# Patient Record
Sex: Female | Born: 1980
Health system: Southern US, Community
[De-identification: ages and names within clinical notes are randomized; demographics above are authoritative.]

## PROBLEM LIST (undated history)

## (undated) DIAGNOSIS — R0982 Postnasal drip: Secondary | ICD-10-CM

## (undated) DIAGNOSIS — J4530 Mild persistent asthma, uncomplicated: Principal | ICD-10-CM

## (undated) DIAGNOSIS — D649 Anemia, unspecified: Secondary | ICD-10-CM

## (undated) HISTORY — DX: Anemia, unspecified: D64.9

## (undated) HISTORY — DX: Postnasal drip: R09.82

## (undated) HISTORY — DX: Mild persistent asthma, uncomplicated: J45.30

---

## 1999-03-24 ENCOUNTER — Emergency Department (HOSPITAL_COMMUNITY): Admission: EM | Admit: 1999-03-24 | Discharge: 1999-03-24 | Payer: Self-pay | Admitting: Endocrinology

## 2001-12-05 ENCOUNTER — Emergency Department (HOSPITAL_COMMUNITY): Admission: EM | Admit: 2001-12-05 | Discharge: 2001-12-05 | Payer: Self-pay | Admitting: Emergency Medicine

## 2002-02-23 ENCOUNTER — Other Ambulatory Visit: Admission: RE | Admit: 2002-02-23 | Discharge: 2002-02-23 | Payer: Self-pay | Admitting: *Deleted

## 2002-05-18 ENCOUNTER — Ambulatory Visit (HOSPITAL_COMMUNITY): Admission: RE | Admit: 2002-05-18 | Discharge: 2002-05-18 | Payer: Self-pay | Admitting: Obstetrics and Gynecology

## 2002-05-18 ENCOUNTER — Encounter: Payer: Self-pay | Admitting: Obstetrics and Gynecology

## 2002-07-12 ENCOUNTER — Encounter (HOSPITAL_COMMUNITY): Admission: RE | Admit: 2002-07-12 | Discharge: 2002-08-11 | Payer: Self-pay | Admitting: *Deleted

## 2002-07-27 ENCOUNTER — Inpatient Hospital Stay (HOSPITAL_COMMUNITY): Admission: AD | Admit: 2002-07-27 | Discharge: 2002-07-27 | Payer: Self-pay | Admitting: *Deleted

## 2002-07-30 ENCOUNTER — Inpatient Hospital Stay (HOSPITAL_COMMUNITY): Admission: AD | Admit: 2002-07-30 | Discharge: 2002-08-01 | Payer: Self-pay | Admitting: Obstetrics and Gynecology

## 2002-07-31 ENCOUNTER — Encounter: Payer: Self-pay | Admitting: Obstetrics and Gynecology

## 2002-08-17 ENCOUNTER — Encounter: Admission: RE | Admit: 2002-08-17 | Discharge: 2002-08-17 | Payer: Self-pay | Admitting: *Deleted

## 2002-08-19 ENCOUNTER — Encounter (INDEPENDENT_AMBULATORY_CARE_PROVIDER_SITE_OTHER): Payer: Self-pay | Admitting: *Deleted

## 2002-08-19 ENCOUNTER — Inpatient Hospital Stay (HOSPITAL_COMMUNITY): Admission: AD | Admit: 2002-08-19 | Discharge: 2002-08-22 | Payer: Self-pay | Admitting: *Deleted

## 2002-09-27 ENCOUNTER — Other Ambulatory Visit: Admission: RE | Admit: 2002-09-27 | Discharge: 2002-09-27 | Payer: Self-pay | Admitting: *Deleted

## 2003-12-02 ENCOUNTER — Emergency Department (HOSPITAL_COMMUNITY): Admission: EM | Admit: 2003-12-02 | Discharge: 2003-12-02 | Payer: Self-pay | Admitting: Emergency Medicine

## 2004-05-02 ENCOUNTER — Other Ambulatory Visit: Admission: RE | Admit: 2004-05-02 | Discharge: 2004-05-02 | Payer: Self-pay | Admitting: Obstetrics and Gynecology

## 2005-03-22 ENCOUNTER — Emergency Department (HOSPITAL_COMMUNITY): Admission: EM | Admit: 2005-03-22 | Discharge: 2005-03-22 | Payer: Self-pay | Admitting: Emergency Medicine

## 2005-05-31 ENCOUNTER — Other Ambulatory Visit: Admission: RE | Admit: 2005-05-31 | Discharge: 2005-05-31 | Payer: Self-pay | Admitting: Obstetrics and Gynecology

## 2008-08-13 ENCOUNTER — Inpatient Hospital Stay (HOSPITAL_COMMUNITY): Admission: AD | Admit: 2008-08-13 | Discharge: 2008-08-13 | Payer: Self-pay | Admitting: Obstetrics & Gynecology

## 2009-05-23 ENCOUNTER — Ambulatory Visit: Payer: Self-pay | Admitting: Obstetrics & Gynecology

## 2009-05-30 ENCOUNTER — Ambulatory Visit: Payer: Self-pay | Admitting: Obstetrics & Gynecology

## 2009-05-30 ENCOUNTER — Inpatient Hospital Stay (HOSPITAL_COMMUNITY): Admission: AD | Admit: 2009-05-30 | Discharge: 2009-05-30 | Payer: Self-pay | Admitting: Obstetrics and Gynecology

## 2009-05-31 ENCOUNTER — Inpatient Hospital Stay (HOSPITAL_COMMUNITY): Admission: AD | Admit: 2009-05-31 | Discharge: 2009-05-31 | Payer: Self-pay | Admitting: Obstetrics and Gynecology

## 2009-06-07 ENCOUNTER — Ambulatory Visit: Payer: Self-pay | Admitting: Obstetrics and Gynecology

## 2009-06-13 ENCOUNTER — Ambulatory Visit: Payer: Self-pay | Admitting: Obstetrics & Gynecology

## 2009-06-18 ENCOUNTER — Inpatient Hospital Stay (HOSPITAL_COMMUNITY): Admission: AD | Admit: 2009-06-18 | Discharge: 2009-06-21 | Payer: Self-pay | Admitting: Obstetrics and Gynecology

## 2009-06-18 ENCOUNTER — Encounter (INDEPENDENT_AMBULATORY_CARE_PROVIDER_SITE_OTHER): Payer: Self-pay | Admitting: Obstetrics and Gynecology

## 2010-04-15 HISTORY — PX: ABDOMINAL HYSTERECTOMY: SHX81

## 2010-07-08 LAB — URINE MICROSCOPIC-ADD ON

## 2010-07-08 LAB — URINALYSIS, ROUTINE W REFLEX MICROSCOPIC
Bilirubin Urine: NEGATIVE
Glucose, UA: NEGATIVE mg/dL
Ketones, ur: NEGATIVE mg/dL
Nitrite: NEGATIVE
Protein, ur: NEGATIVE mg/dL
Specific Gravity, Urine: <1.005 — ABNORMAL LOW (ref 1.005–1.030)
Urobilinogen, UA: 0.2 mg/dL (ref 0.0–1.0)
pH: 6 (ref 5.0–8.0)

## 2010-07-08 LAB — CBC
HCT: 36.9 % (ref 36.0–46.0)
HCT: 37 % (ref 36.0–46.0)
Hemoglobin: 12.1 g/dL (ref 12.0–15.0)
Hemoglobin: 12.2 g/dL (ref 12.0–15.0)
MCHC: 32.9 g/dL (ref 30.0–36.0)
MCHC: 33 g/dL (ref 30.0–36.0)
MCV: 90.6 fL (ref 78.0–100.0)
MCV: 90.8 fL (ref 78.0–100.0)
Platelets: 142 10*3/uL — ABNORMAL LOW (ref 150–400)
Platelets: 143 10*3/uL — ABNORMAL LOW (ref 150–400)
RBC: 4.07 MIL/uL (ref 3.87–5.11)
RBC: 4.07 MIL/uL (ref 3.87–5.11)
RDW: 13.6 % (ref 11.5–15.5)
RDW: 13.6 % (ref 11.5–15.5)
WBC: 14.9 10*3/uL — ABNORMAL HIGH (ref 4.0–10.5)
WBC: 7 10*3/uL (ref 4.0–10.5)

## 2010-07-08 LAB — RPR: RPR Ser Ql: NONREACTIVE

## 2010-08-31 NOTE — Discharge Summary (Signed)
NAME:  Tina Eaton, Tina Eaton                       ACCOUNT NO.:  [id number]   MEDICAL RECORD NO.:  [id number]                   PATIENT TYPE:  INP   LOCATION:  9139                                 FACILITY:  [facility]   PHYSICIAN:  Dineen Kid. Rana Snare, M.D.                 DATE OF BIRTH:  02/12/1981   DATE OF ADMISSION:  08/19/2002  DATE OF DISCHARGE:  08/22/2002                                 DISCHARGE SUMMARY   ADMITTING DIAGNOSES:  1. Intrauterine pregnancy at term with twin gestation.  2. Twin A in the frank breech presentation.   DISCHARGE DIAGNOSES:  1. Status post primary low transverse cesarean section.  2. Viable female and female infants.   PROCEDURE:  Primary low transverse cesarean section.   REASON FOR ADMISSION:  Please see dictated H&P.   HOSPITAL COURSE:  The patient was a 30 year old primigravida that was  admitted to [facility] with a twin gestation at 48 weeks  estimated gestational age.  The patient was scheduled for a cesarean  delivery due to twin A in the breech presentation.  On the morning of her  surgery patient was prepped accordingly and taken to the operating room  where spinal anesthesia was administered without difficulty.  A low  transverse incision was made with delivery of twin A, a female weighing 7  pounds 2 ounces, Apgars of 8 at one minute and 9 at five minutes and twin B,  a female infant weighing 5 pounds 4 ounces, Apgars of 9 at one minute and 9 at  five minutes were delivered.  The patient tolerated procedure well and was  taken to the recovery room in stable condition.  On postoperative day one  vital signs were stable.  The patient had good return of bowel function.  Abdomen was soft.  Abdominal dressing was clean, dry, and intact.  Fundus  was firm and nontender.  Laboratories reveal hemoglobin 12.8, platelet count  156,000, WBC count of 7.9.  On postoperative day two patient was doing well.  Vital signs were stable.  Fundus was firm  and nontender.  Abdominal dressing  was removed that revealed an incision that was clean, dry, and intact.  The  patient was tolerating a regular diet without complaints of nausea and  vomiting.  She was also ambulating without assistance.  On postoperative day  three patient was doing well.  Vital signs were stable.  She was afebrile.  Fundus was firm and nontender.  Incision was clean, dry, and intact.  Staples were removed and patient was discharged home.   CONDITION ON DISCHARGE:  Good.   DIET:  Regular, as tolerated.   ACTIVITY:  No heavy lifting.  No driving x2 weeks.  No vaginal entry.   FOLLOW UP:  The patient is to follow up in the office in one to two weeks  for an incision check.  She is to call for temperature  greater than 100  degrees, persistent nausea and vomiting, heavy vaginal bleeding, and/or  redness or drainage from the incisional site.   DISCHARGE MEDICATIONS:  1. Tylox numbers 30 one p.o. q.4-6h. p.r.n.  2.     Prenatal vitamins one p.o. daily.  3. Colace one p.o. daily p.r.n.  4. Ibuprofen 600 mg q.6h. p.r.n.     Tina Eaton, N.P.                        Dineen Kid Rana Snare, M.D.    CC/MEDQ  D:  09/20/2002  T:  09/20/2002  Job:  [id number]

## 2010-08-31 NOTE — Op Note (Signed)
NAME:  Tina Eaton, Tina Eaton                       ACCOUNT NO.:  [id number]   MEDICAL RECORD NO.:  [id number]                   PATIENT TYPE:  INP   LOCATION:  9198                                 FACILITY:  [facility]   PHYSICIAN:  Tracie Harrier, M.D.              DATE OF BIRTH:  08-09-1980   DATE OF PROCEDURE:  08/19/2002  DATE OF DISCHARGE:                                 OPERATIVE REPORT   PREOPERATIVE DIAGNOSES:  1. Intrauterine pregnancy/twins at term.  2. Homero Fellers breech/vertex presentation.   POSTOPERATIVE DIAGNOSES:  1. Intrauterine pregnancy/twins at term.  2. Homero Fellers breech/vertex presentation.   PROCEDURE:  Primary low transverse cesarean section.   SURGEON:  Tracie Harrier, M.D.   ASSISTANT:  Stann Mainland. Vincente Poli, M.D.   ANESTHESIA:  Spinal.   ESTIMATED BLOOD LOSS:  800 mL.   COMPLICATIONS:  None.   FINDINGS:  At 22 through a low transverse uterine incision a viable female  infant was delivered from the frank breech presentation.  The baby weighed 7  pounds 2 ounces.  Apgars 8 and 9.   Baby B was delivered from the vertex presentation at 0751.  The baby was a  female weighing 5 pounds 4 ounces.  Apgars were 9 and 9.   The pelvis was visualized at time of surgery and noted to be normal.   PROCEDURE:  The patient was taken to the operating room where a spinal  anesthetic was administered.  The patient was placed on the operating table  in the left lateral tilt position.  The abdomen was prepped and draped in  the usual sterile fashion with Betadine and sterile drapes.  A Foley  catheter was sterilely inserted.  The abdomen was entered through a  Pfannenstiel incision and carried down sharply in the usual fashion.  The  peritoneum was atraumatically entered.  The vesicouterine peritoneum  overlying the lower uterine segment was incised and a bladder flap was  bluntly and sharply created over the lower uterine segment.  A bladder blade  was then placed behind the  bladder to ensure its protection during the  procedure.  The uterus was then entered through a low transverse incision  and carried out laterally using the operator's fingers.  The membranes were  entered with clear fluid noted.  The frank breech presentation was elevated  into the incision and delivered promptly and easily with the usual breech  maneuvers with delivery at 0750.  The oro and nasopharynx were thoroughly  bulb suctioned and the cord doubly clamped and cut.  The baby handed  promptly to the pediatricians.  The baby was a female infant weighing 7  pounds 2 ounces.  Apgars were 8 and 9.  The baby did well.  Next, baby B's  membranes were ruptured with once again clear fluid noted.  The vertex was  guided into the incision and delivered promptly and easily at 0751.  The oro  and nasopharynx were once again thoroughly bulb suctioned, the cord doubly  clamped and cut, and the baby handed promptly to the pediatricians.  Baby B  was a female infant weighing 5 pounds 4 ounces.  Apgars were 9 and 9.  Baby B  also did well.   The placenta was then manually extracted intact with three vessel cord x2.  The interior of the uterus was wiped clean thoroughly with a wet sponge.  The uterine incision was then closed in a two layer fashion, the first layer  a running interlocking suture of number 1 Vicryl.  A second imbricating  suture was placed across the primary suture line with a running stitch of  number 1 Vicryl as well.  Hemostasis was achieved.  The pelvis was  visualized and noted to be normal.  The pelvis was then thoroughly irrigated  with hemostasis noted.   Attention was then turned to closure.  The rectus muscle and anterior  peritoneum was closed with a running suture of number 1 Vicryl.  The  subfascial areas were hemostatic.  The fascia was then closed with a running  suture of 0 PDS.  Subcutaneous tissue was irrigated and made hemostatic  using the Bovie cautery.  The skin  reapproximated with staples and a sterile  dressing applied.   Final sponge, needle, and instrument count was correct x3.  There were no  perioperative complications and blood loss was average.  The patient did  receive an intravenous antibiotic (Cefotan) after cord clamp of baby B.                                               Tracie Harrier, M.D.    REG/MEDQ  D:  08/19/2002  T:  08/19/2002  Job:  [id number]

## 2010-08-31 NOTE — Discharge Summary (Signed)
   NAME:  Tina Eaton, Tina Eaton                       ACCOUNT NO.:  [id number]   MEDICAL RECORD NO.:  [id number]                   PATIENT TYPE:  INP   LOCATION:  9158                                 [facility]ACILITY:  [facility]   PHYSICIAN:  Juluis Mire, M.D.                DATE OF BIRTH:  1980-08-23   DATE OF ADMISSION:  07/30/2002  DATE OF DISCHARGE:  08/01/2002                                 DISCHARGE SUMMARY   ADMISSION DIAGNOSES:  Twin pregnancies at 39 1/2 weeks with pre-term uterine  activity.   DISCHARGE DIAGNOSES:  Twin pregnancies at 17 1/2 weeks with pre-term uterine  activity.   PROCEDURE:  Tocolysis.   HISTORY AND PHYSICAL:  Please see written note.   HOSPITAL COURSE:  The patient was admitted and begun on IV magnesium  sulfate. She reached a max of 2 grams per hour. With this, she had  resolution of uterine activity. An ultrasound was performed which reveals  the twin pregnancies to be in the vertex breech presentation. Bi-physical  on A was 6 out of 8 with normal amniotic fluid. Bi-physical on twin B was 8  out of 8 with normal amniotic fluid. Cervical length was 1.5 cm. We weaned  her off her magnesium sulfate through the 17th. She was maintained on by  mouth Terbutaline throughout the evening of the 17th. On the morning of the  18th, she was having no uterine activities. Fetal heart rates of A and B  were reactive. Cervix was completely unchanged and the patient was  discharged to home.   LABORATORY DATA:  Normal white count of 7,600. Platelet count was 151,000.   CONDITION ON DISCHARGE:  Stable.   DISPOSITION:  Discharge to home. Routine instructions given for pre-term  labor. She is to be at relative rest. No vaginal entrance. She will continue  on by mouth Terbutaline 2.5 mg every four hours. She will follow-up in the  office on Tuesday for reassessment. Pre-term labor warning signs are again  given.                                                Juluis Mire,  M.D.    JSM/MEDQ  D:  08/01/2002  T:  08/01/2002  Job:  [id number]

## 2010-08-31 NOTE — H&P (Signed)
   NAME:  Tina Eaton, Tina Eaton                       ACCOUNT NO.:  [id number]   MEDICAL RECORD NO.:  [id number]                   PATIENT TYPE:  INP   LOCATION:  9198                                 [facility]   PHYSICIAN:  Tracie Harrier, M.D.              DATE OF BIRTH:  12/21/1980   DATE OF ADMISSION:  08/19/2002  DATE OF DISCHARGE:                                HISTORY & PHYSICAL   HISTORY OF PRESENT ILLNESS:  The patient is a 30 year old female gravida 1  with twin gestation at [redacted] weeks gestation.  The patient is admitted for  primary cesarean section due to breech and vertex presentation.  Her  pregnancy has been complicated again by breech presentation and preterm  labor.  She has been followed very closely with serial ultrasounds and  nonstress tests throughout her pregnancy.  She has also been on terbutaline  for preterm labor.  Her last cervical examination was 2 cm dilated, 50%  effaced, -3 station.  She has had a persistent breech presentation for baby  A.  The babies have showed concordant growth and have exhibited reassuring  fetal surveillance.   OB LABORATORIES:  Maternal blood type A+.  Rubella immune.  Glucola 138.  Group B Strep unknown.   PAST MEDICAL HISTORY:  History of Chlamydia.   PAST SURGICAL HISTORY:  None.   CURRENT MEDICATIONS:  1. Prenatal vitamins.  2. Terbutaline.   ALLERGIES:  None known.   PHYSICAL EXAMINATION:  VITAL SIGNS:  Stable.  Blood pressure 110/72.  Fetal  heart tones reassuring x2.  GENERAL:  She is a well-developed, well-nourished female in no acute  distress.  HEENT:  Within normal limits.  NECK:  Supple without adenopathy or thyromegaly.  HEART:  Regular rate and rhythm without murmur, gallop, or rub.  LUNGS:  Clear to auscultation.  BREASTS:  Deferred.  ABDOMEN:  Gravid with a fundal height of 50 cm.  EXTREMITIES:  Grossly normal.  NEUROLOGIC:  Grossly normal.  PELVIC:  Deferred upon admission.   ADMITTING  DIAGNOSES:  1. Intrauterine pregnancy/twin gestation at 38 weeks.  2. Breech/vertex presentation.   PLAN:  Primary cesarean section.    DISCUSSION:  The risks and benefits of surgery explained to patient.  The  risk of surgery and the need for surgery given baby A's position was  reviewed.  She was allowed to ask questions and wished to proceed with  primary cesarean section.                                               Tracie Harrier, M.D.    REG/MEDQ  D:  08/19/2002  T:  08/19/2002  Job:  [id number]

## 2010-09-05 ENCOUNTER — Encounter (HOSPITAL_COMMUNITY): Payer: 59

## 2010-09-05 ENCOUNTER — Other Ambulatory Visit: Payer: Self-pay | Admitting: Obstetrics and Gynecology

## 2010-09-05 LAB — CBC
HCT: 37.6 % (ref 36.0–46.0)
Hemoglobin: 12.2 g/dL (ref 12.0–15.0)
MCH: 28.6 pg (ref 26.0–34.0)
MCHC: 32.4 g/dL (ref 30.0–36.0)
MCV: 88.1 fL (ref 78.0–100.0)
Platelets: 224 10*3/uL (ref 150–400)
RBC: 4.27 MIL/uL (ref 3.87–5.11)
RDW: 12.3 % (ref 11.5–15.5)
WBC: 5 10*3/uL (ref 4.0–10.5)

## 2010-09-05 LAB — SURGICAL PCR SCREEN
MRSA, PCR: NEGATIVE
Staphylococcus aureus: NEGATIVE

## 2010-09-12 ENCOUNTER — Ambulatory Visit (HOSPITAL_COMMUNITY)
Admission: RE | Admit: 2010-09-12 | Discharge: 2010-09-13 | Disposition: A | Discharge status: Home / Self Care | Payer: 59 | Source: Ambulatory Visit | Attending: Obstetrics and Gynecology | Admitting: Obstetrics and Gynecology

## 2010-09-12 ENCOUNTER — Other Ambulatory Visit: Payer: Self-pay | Admitting: Obstetrics and Gynecology

## 2010-09-12 DIAGNOSIS — Z01812 Encounter for preprocedural laboratory examination: Secondary | ICD-10-CM | POA: Insufficient documentation

## 2010-09-12 DIAGNOSIS — Z01818 Encounter for other preprocedural examination: Secondary | ICD-10-CM | POA: Insufficient documentation

## 2010-09-12 DIAGNOSIS — D252 Subserosal leiomyoma of uterus: Secondary | ICD-10-CM | POA: Insufficient documentation

## 2010-09-12 DIAGNOSIS — N83209 Unspecified ovarian cyst, unspecified side: Secondary | ICD-10-CM | POA: Insufficient documentation

## 2010-09-12 DIAGNOSIS — D251 Intramural leiomyoma of uterus: Secondary | ICD-10-CM | POA: Insufficient documentation

## 2010-09-12 DIAGNOSIS — N920 Excessive and frequent menstruation with regular cycle: Secondary | ICD-10-CM | POA: Insufficient documentation

## 2010-09-12 DIAGNOSIS — R1031 Right lower quadrant pain: Secondary | ICD-10-CM | POA: Insufficient documentation

## 2010-09-13 LAB — CBC
HCT: 33.5 % — ABNORMAL LOW (ref 36.0–46.0)
Hemoglobin: 11 g/dL — ABNORMAL LOW (ref 12.0–15.0)
MCH: 29.2 pg (ref 26.0–34.0)
MCHC: 32.8 g/dL (ref 30.0–36.0)
MCV: 88.9 fL (ref 78.0–100.0)
Platelets: 225 10*3/uL (ref 150–400)
RBC: 3.77 MIL/uL — ABNORMAL LOW (ref 3.87–5.11)
RDW: 12.4 % (ref 11.5–15.5)
WBC: 6.9 10*3/uL (ref 4.0–10.5)

## 2010-09-13 NOTE — H&P (Signed)
  Tina Eaton, Tina Eaton             ACCOUNT NO.:  [id number]  MEDICAL RECORD NO.:  [id number]        PATIENT TYPE:  WAMB  LOCATION:                                FACILITY:  [facility]  PHYSICIAN:  Guy Sandifer. Henderson Cloud, M.D. DATE OF BIRTH:  [birth year]  DATE OF ADMISSION:  09/12/2010 DATE OF DISCHARGE:                             HISTORY & PHYSICAL   CHIEF COMPLAINT:  Heavy painful menses.  HISTORY OF PRESENT ILLNESS:  This patient is a 30 year old G2, P4, status post C-section and tubal ligation who has increasingly heavy menses.  She will change a tampon and a pad every hour on several days each month.  She has also had right lower quadrant pain especially with her menses.  Ultrasound in my office revealed a uterus measuring 11.1 x 6.3 x 6.9 cm.  There are multiple intramural and subserosal leiomyomata measuring 1.4-2.4 cm in diameter.  Left ovary has a 4-cm simple cyst at the time of ultrasound in January 2012.  Right ovary appeared normal. Sonohysterogram was negative for intracavitary masses.  The bleeding has gotten steadily worse and after discussion of options she is being admitted for laparoscopically-assisted vaginal hysterectomy and removal of the tube and ovary only if distinctly abnormal.  Potential risks and complications have been discussed preoperatively.  PAST MEDICAL HISTORY:  Negative.  PAST SURGICAL HISTORY:  Negative.  OB HISTORY:  Cesarean section x2.  MEDICATIONS:  None.  ALLERGIES:  No known drug allergies.  SOCIAL HISTORY:  Denies tobacco, alcohol or drug abuse.  FAMILY HISTORY:  Positive for varicosities in her mother, maternal grandmother, breast cancer maternal grandmother.  REVIEW OF SYSTEMS:  NEURO:  Denies headache.  CARDIAC:  Denies chest pain.  PULMONARY:  Denies shortness of breath.  PHYSICAL EXAMINATION:  LUNGS:  Clear to auscultation. HEART:  Regular rate and rhythm. ABDOMEN:  Soft, nontender without masses. PELVIC:  Uterus is mobile and  tender.  Exam somewhat compromised due to the patient's habitus.  Adnexal exam without palpable masses. EXTREMITIES:  Grossly within normal limits. NEUROLOGICAL:  Grossly within normal limits.  ASSESSMENT:  Uterine leiomyomata.  PLAN:  Laparoscopically-assisted vaginal hysterectomy.     Guy Sandifer Henderson Cloud, M.D.     JET/MEDQ  D:  09/11/2010  T:  09/11/2010  Job:  [id number]  Electronically Signed by Harold Hedge M.D. on 09/13/2010 01:16:00 PM

## 2010-09-13 NOTE — Op Note (Signed)
Tina Eaton, Tina Eaton             ACCOUNT NO.:  [id number]  MEDICAL RECORD NO.:  [id number]           PATIENT TYPE:  O  LOCATION:  9316                          FACILITY:  [facility]  PHYSICIAN:  Guy Sandifer. Henderson Cloud, M.D. DATE OF BIRTH:  February 21, 1981  DATE OF PROCEDURE:  09/12/2010 DATE OF DISCHARGE:                              OPERATIVE REPORT   PREOPERATIVE DIAGNOSIS:  Uterine leiomyomata.  POSTOPERATIVE DIAGNOSES: 1. Uterine leiomyomata. 2. Pelvic adhesions.  PROCEDURES:  Laparoscopically-assisted vaginal hysterectomy, bilateral partial salpingectomy, lysis of adhesions.  SURGEON:  Guy Sandifer. Henderson Cloud, MD  ASSISTANT:  Juluis Mire, MD  ANESTHESIA:  General endotracheal intubation.  SPECIMENS:  Uterus and bilateral proximal fallopian tube segments to pathology.  ESTIMATED BLOOD LOSS:  100 mL.  INDICATIONS AND CONSENT:  This patient is a 30 year old G2, P4, status post C-section tubal ligation with increasingly heavy menses.  Details are dictated in the history and physical.  Laparoscopically-assisted vaginal hysterectomy, removal of the tube and ovary if distinctly abnormal have been discussed preoperatively.  Potential risks and complications have been discussed preoperatively including but not limited to infection, organ damage, bleeding requiring transfusion of blood products with HIV and hepatitis acquisition, DVT, PE, pneumonia, fistula formation, pelvic pain, pain with intercourse, laparotomy.  All questions have been answered and consent was signed on the chart.  FINDINGS:  Upper abdomen is grossly normal.  Uterus is about 10 weeks in size.  There is approximately 2 cm thick adhesion from the anterior upper uterine fundus to the anterior abdominal wall.  There are multiple filmy adhesions below this extending to the vesicouterine fold.  The right fallopian tube proximal to her point of tubal ligation has approximately 1 cm diameter hydrosalpinx.  The ovaries are  normal bilaterally.  Posterior cul-de-sac is normal.  PROCEDURE:  The patient was taken to the operating room where she was identified, placed in the dorsal supine position and general anesthesia was induced via endotracheal intubation.  She was placed in the dorsal lithotomy position.  Time-out was undertaken.  Using latex free supplies, the abdomen and vagina were prepped with Betadine.  Straight catheterized.  Hulka tenaculum was placed in the uterus as a manipulator and she was draped in a sterile fashion.  The infraumbilical and suprapubic areas were injected in the midline with approximately 5 mL of 0.5% plain Marcaine.  A small infraumbilical incision was made. Disposable Veress needle was placed on the first attempt without difficulty.  A normal syringe and drop test were noted.  Two liters of gas were then insufflated under low pressure with good tympany in the right upper quadrant.  Veress needle was removed.  A 10/11 Xcel bladeless disposable trocar sleeve was placed using direct visualization with the diagnostic laparoscopic.  After placement, the operative laparoscope was used.  The above findings were noted.  Using the Enseal bipolar cautery cutting instrument, the adhesions of the uterine fundus and the anterior abdominal wall were taken down.  Then, a small suprapubic incision was made in the midline above the level of the previous adhesion.  A 5-mm Xcel bladeless disposable trocar sleeve was then placed under direct visualization.  There was thickening of the pedicles bilaterally above the vesicouterine fold.  However, these were taken down in a careful stepwise fashion along the uterine contour using the Enseal bipolar cautery cutting instrument.  This was done well clear of the pelvic sidewalls down to the level of the vesicouterine peritoneum bilaterally.  Vesicouterine peritoneum was then taken down cephalad laterally with the Enseal product as well.  Good hemostasis  was noted.  Suprapubic trocar sleeve was removed along with instruments. Attention was then turned to the vagina.  Posterior cul-de-sac was entered sharply without difficulty and the cervix was circumscribed with unipolar cautery.  Mucosa was advanced sharply and bluntly.  Then, using the super jaws bipolar cautery cutting instrument, the uterosacral ligaments were taken down in a stepwise fashion to the bladder pillars, cardinal ligaments, uterine vessels bilaterally.  Fundus was delivered posteriorly and the remainder of the pedicles were taken down and the specimen was safely delivered.  The uterosacral ligaments were then plicated to the vaginal cuff bilaterally using 0 Monocryl suture.  All suture would be 0 Monocryl unless otherwise designated.  Uterosacral ligament was then plicated in the midline with a third suture.  Cuff was closed with figure-of-eights.  Foley catheter was placed in the bladder and clear urine was noted.  Attention was then returned to the abdomen. Pneumoperitoneum was recreated.  Suprapubic trocar sleeve was reintroduced under direct visualization and copious irrigation was carried out.  Very minor oozing at peritoneal edges was controlled with bipolar cautery.  The remaining 25 mL of 0.5% plain Marcaine was instilled into the peritoneal cavity.  Pneumoperitoneum was reduced and all trocar sleeves were removed as well.  The skin incisions were closed with interrupted 2-0 Vicryl suture.  Dermabond was placed on both incisions.  All counts were correct.  The patient was awakened and taken to recovery room in stable condition.     Guy Sandifer Henderson Cloud, M.D.     JET/MEDQ  D:  09/12/2010  T:  09/12/2010  Job:  [id number]  Electronically Signed by Harold Hedge M.D. on 09/13/2010 01:16:05 PM

## 2010-10-12 NOTE — Discharge Summary (Signed)
  NAMEJAYONNA, MEYERING             ACCOUNT NO.:  [id number]  MEDICAL RECORD NO.:  [id number]           PATIENT TYPE:  O  LOCATION:  9316                          [facility]ILITY:  [facility]  PHYSICIAN:  Guy Sandifer. Henderson Cloud, M.D. DATE OF BIRTH:  11/03/1980  DATE OF ADMISSION:  09/12/2010 DATE OF DISCHARGE:  09/13/2010                              DISCHARGE SUMMARY   ADMITTING DIAGNOSIS:  Uterine leiomyomata.  DISCHARGE DIAGNOSES:  Uterine leiomyomata and pelvic adhesions.  PROCEDURE:  On May 31 is laparoscopically-assisted vaginal hysterectomy, bilateral partial salpingectomy, and lysis of adhesions.  REASON FOR ADMISSION:  This patient is a 30 year old married black female status post tubal ligation with increasingly symptomatic uterine leiomyomata.  Details are dictated in the history and physical.  She is admitted for surgical management.  HOSPITAL COURSE:  She admits she is admitted to the hospital, undergoes the above procedure.  Estimated blood loss was 100 mL.  On the evening of surgery, she has good pain relief and she is ambulating.  Vital signs are stable.  She was afebrile with clear urine output.  On the day of discharge, she was tolerating regular diet, ambulating, voiding, passing flatus with good pain control and wants to go home.  Vital signs were stable.  She was afebrile.  Abdomen was soft with good bowel sounds. Hemoglobin was 11.0.  CONDITION ON DISCHARGE:  Good.  DIET:  Regular as tolerated.  ACTIVITY:  No lifting, no operation of automobiles, no vaginal entry. She is to call the office for problems including not limited to temperature of 101 degrees, persistent nausea, vomiting, increasing pain, or heavy vaginal bleeding.  MEDICATIONS: 1. Percocet 5/325, #30, 1-2 p.o. q.6 h. p.r.n. 2. Ibuprofen 600 mg q.6 h. p.r.n. 3. Multivitamin daily.  Followup is in the office in 2 weeks.     Guy Sandifer Henderson Cloud, M.D.     JET/MEDQ  D:  09/13/2010  T:  09/14/2010  Job:   [id number]  Electronically Signed by Harold Hedge M.D. on 10/12/2010 09:11:53 AM

## 2011-03-05 ENCOUNTER — Other Ambulatory Visit: Payer: Self-pay | Admitting: Dermatology

## 2012-01-24 ENCOUNTER — Encounter: Payer: Self-pay | Admitting: Internal Medicine

## 2012-01-24 ENCOUNTER — Other Ambulatory Visit (INDEPENDENT_AMBULATORY_CARE_PROVIDER_SITE_OTHER): Payer: 59

## 2012-01-24 ENCOUNTER — Ambulatory Visit (INDEPENDENT_AMBULATORY_CARE_PROVIDER_SITE_OTHER): Payer: 59 | Admitting: Internal Medicine

## 2012-01-24 VITALS — BP 108/72 | HR 71 | Temp 98.5°F | Resp 16 | Ht 64.0 in | Wt 249.5 lb

## 2012-01-24 DIAGNOSIS — R0989 Other specified symptoms and signs involving the circulatory and respiratory systems: Secondary | ICD-10-CM

## 2012-01-24 DIAGNOSIS — J4530 Mild persistent asthma, uncomplicated: Secondary | ICD-10-CM | POA: Insufficient documentation

## 2012-01-24 DIAGNOSIS — R079 Chest pain, unspecified: Secondary | ICD-10-CM

## 2012-01-24 DIAGNOSIS — R0683 Snoring: Secondary | ICD-10-CM

## 2012-01-24 DIAGNOSIS — R0609 Other forms of dyspnea: Secondary | ICD-10-CM

## 2012-01-24 DIAGNOSIS — R0600 Dyspnea, unspecified: Secondary | ICD-10-CM

## 2012-01-24 HISTORY — DX: Mild persistent asthma, uncomplicated: J45.30

## 2012-01-24 LAB — COMPREHENSIVE METABOLIC PANEL
ALT: 17 U/L (ref 0–35)
AST: 14 U/L (ref 0–37)
Albumin: 4 g/dL (ref 3.5–5.2)
Alkaline Phosphatase: 53 U/L (ref 39–117)
BUN: 11 mg/dL (ref 6–23)
CO2: 26 mEq/L (ref 19–32)
Calcium: 9.2 mg/dL (ref 8.4–10.5)
Chloride: 106 mEq/L (ref 96–112)
Creatinine, Ser: 0.7 mg/dL (ref 0.4–1.2)
GFR: 128 mL/min (ref >60.00)
Glucose, Bld: 97 mg/dL (ref 70–99)
Potassium: 4.2 mEq/L (ref 3.5–5.1)
Sodium: 138 mEq/L (ref 135–145)
Total Bilirubin: 0.7 mg/dL (ref 0.3–1.2)
Total Protein: 7.4 g/dL (ref 6.0–8.3)

## 2012-01-24 LAB — CBC WITH DIFFERENTIAL/PLATELET
Basophils Absolute: 0 10*3/uL (ref 0.0–0.1)
Basophils Relative: 0.6 % (ref 0.0–3.0)
Eosinophils Absolute: 0.1 10*3/uL (ref 0.0–0.7)
Eosinophils Relative: 2.3 % (ref 0.0–5.0)
HCT: 38.2 % (ref 36.0–46.0)
Hemoglobin: 12.6 g/dL (ref 12.0–15.0)
Lymphocytes Relative: 43.2 % (ref 12.0–46.0)
Lymphs Abs: 2.4 10*3/uL (ref 0.7–4.0)
MCHC: 33 g/dL (ref 30.0–36.0)
MCV: 89.7 fl (ref 78.0–100.0)
Monocytes Absolute: 0.3 10*3/uL (ref 0.1–1.0)
Monocytes Relative: 5.9 % (ref 3.0–12.0)
Neutro Abs: 2.7 10*3/uL (ref 1.4–7.7)
Neutrophils Relative %: 48 % (ref 43.0–77.0)
Platelets: 211 10*3/uL (ref 150.0–400.0)
RBC: 4.25 Mil/uL (ref 3.87–5.11)
RDW: 12.8 % (ref 11.5–14.6)
WBC: 5.5 10*3/uL (ref 4.5–10.5)

## 2012-01-24 LAB — BRAIN NATRIURETIC PEPTIDE: Pro B Natriuretic peptide (BNP): 8 pg/mL (ref 0.0–100.0)

## 2012-01-24 LAB — TSH: TSH: 1.02 u[IU]/mL (ref 0.35–5.50)

## 2012-01-24 LAB — TROPONIN I: Troponin I: <0.01 ng/mL

## 2012-01-24 NOTE — Assessment & Plan Note (Signed)
Her EKG shows loss of voltage and diffuse T wave inversion (this is c/w her body habitus), I will check her labs today to see if she has any evidence of ischemia, CHF, anemia, or abnormal lytes

## 2012-01-24 NOTE — Assessment & Plan Note (Signed)
This appears to be due to poor conditioning, I will check labs to look for secondary causes

## 2012-01-24 NOTE — Assessment & Plan Note (Signed)
Pulm/sleep referral

## 2012-01-24 NOTE — Patient Instructions (Signed)
Chest Pain (Nonspecific) It is often hard to give a specific diagnosis for the cause of chest pain. There is always a chance that your pain could be related to something serious, such as a heart attack or a blood clot in the lungs. You need to follow up with your caregiver for further evaluation. CAUSES   Heartburn.  Pneumonia or bronchitis.  Anxiety or stress.  Inflammation around your heart (pericarditis) or lung (pleuritis or pleurisy).  A blood clot in the lung.  A collapsed lung (pneumothorax). It can develop suddenly on its own (spontaneous pneumothorax) or from injury (trauma) to the chest.  Shingles infection (herpes zoster virus). The chest wall is composed of bones, muscles, and cartilage. Any of these can be the source of the pain.  The bones can be bruised by injury.  The muscles or cartilage can be strained by coughing or overwork.  The cartilage can be affected by inflammation and become sore (costochondritis). DIAGNOSIS  Lab tests or other studies, such as X-rays, electrocardiography, stress testing, or cardiac imaging, may be needed to find the cause of your pain.  TREATMENT   Treatment depends on what may be causing your chest pain. Treatment may include:  Acid blockers for heartburn.  Anti-inflammatory medicine.  Pain medicine for inflammatory conditions.  Antibiotics if an infection is present.  You may be advised to change lifestyle habits. This includes stopping smoking and avoiding alcohol, caffeine, and chocolate.  You may be advised to keep your head raised (elevated) when sleeping. This reduces the chance of acid going backward from your stomach into your esophagus.  Most of the time, nonspecific chest pain will improve within 2 to 3 days with rest and mild pain medicine. HOME CARE INSTRUCTIONS   If antibiotics were prescribed, take your antibiotics as directed. Finish them even if you start to feel better.  For the next few days, avoid physical  activities that bring on chest pain. Continue physical activities as directed.  Do not smoke.  Avoid drinking alcohol.  Only take over-the-counter or prescription medicine for pain, discomfort, or fever as directed by your caregiver.  Follow your caregiver's suggestions for further testing if your chest pain does not go away.  Keep any follow-up appointments you made. If you do not go to an appointment, you could develop lasting (chronic) problems with pain. If there is any problem keeping an appointment, you must call to reschedule. SEEK MEDICAL CARE IF:   You think you are having problems from the medicine you are taking. Read your medicine instructions carefully.  Your chest pain does not go away, even after treatment.  You develop a rash with blisters on your chest. SEEK IMMEDIATE MEDICAL CARE IF:   You have increased chest pain or pain that spreads to your arm, neck, jaw, back, or abdomen.  You develop shortness of breath, an increasing cough, or you are coughing up blood.  You have severe back or abdominal pain, feel nauseous, or vomit.  You develop severe weakness, fainting, or chills.  You have a fever. THIS IS AN EMERGENCY. Do not wait to see if the pain will go away. Get medical help at once. Call your local emergency services (911 in [place] Do not drive yourself to the hospital. MAKE SURE YOU:   Understand these instructions.  Will watch your condition.  Will get help right away if you are not doing well or get worse. Document Released: 01/09/2005 Document Revised: 06/24/2011 Document Reviewed: 11/05/2007 [employer] Patient Information 2013 [employer].  

## 2012-01-24 NOTE — Progress Notes (Signed)
  Subjective:    Patient ID: Tina Eaton, female    DOB: 1980/06/24, 31 y.o.   MRN: [id number]  Chest Pain  This is a new problem. Episode onset: for 2 months. The onset quality is gradual. The problem occurs intermittently. The problem has been unchanged. The pain is present in the substernal region. The pain is at a severity of 1/10. The quality of the pain is described as sharp. The pain does not radiate. Associated symptoms include shortness of breath. Pertinent negatives include no abdominal pain, back pain, claudication, cough, diaphoresis, dizziness, exertional chest pressure, fever, headaches, hemoptysis, irregular heartbeat, leg pain, lower extremity edema, malaise/fatigue, nausea, near-syncope, numbness, orthopnea, palpitations, PND, sputum production, syncope, vomiting or weakness. The pain is aggravated by exertion. She has tried nothing for the symptoms.      Review of Systems  Constitutional: Positive for fatigue. Negative for fever, chills, malaise/fatigue, diaphoresis, activity change, appetite change and unexpected weight change.  HENT: Negative.   Eyes: Negative.   Respiratory: Positive for apnea (and heavy snoring) and shortness of breath. Negative for cough, hemoptysis, sputum production, choking, chest tightness, wheezing and stridor.   Cardiovascular: Positive for chest pain. Negative for palpitations, orthopnea, claudication, leg swelling, syncope, PND and near-syncope.  Gastrointestinal: Negative for nausea, vomiting, abdominal pain, diarrhea and constipation.  Genitourinary: Negative.   Musculoskeletal: Negative for myalgias, back pain, joint swelling, arthralgias and gait problem.  Skin: Negative for color change, pallor and rash.  Neurological: Negative.  Negative for dizziness, tremors, syncope, weakness, light-headedness, numbness and headaches.  Hematological: Negative for adenopathy. Does not bruise/bleed easily.  Psychiatric/Behavioral: Negative.          Objective:   Physical Exam  Vitals reviewed. Constitutional: She is oriented to person, place, and time. She appears well-developed and well-nourished. No distress.  HENT:  Head: Normocephalic and atraumatic.  Mouth/Throat: Oropharynx is clear and moist. No oropharyngeal exudate.  Eyes: Conjunctivae normal are normal. Right eye exhibits no discharge. Left eye exhibits no discharge. No scleral icterus.  Neck: Normal range of motion. Neck supple. No JVD present. No tracheal deviation present. No thyromegaly present.  Cardiovascular: Normal rate, regular rhythm, normal heart sounds and intact distal pulses.  Exam reveals no gallop and no friction rub.   No murmur heard. Pulmonary/Chest: Effort normal and breath sounds normal. No stridor. No respiratory distress. She has no wheezes. She has no rales. She exhibits no tenderness.  Abdominal: Soft. Bowel sounds are normal. She exhibits no distension and no mass. There is no tenderness. There is no rebound and no guarding.  Musculoskeletal: Normal range of motion. She exhibits no edema and no tenderness.  Lymphadenopathy:    She has no cervical adenopathy.  Neurological: She is oriented to person, place, and time.  Skin: Skin is warm and dry. No rash noted. She is not diaphoretic. No erythema. No pallor.  Psychiatric: She has a normal mood and affect. Her behavior is normal. Judgment and thought content normal.          Assessment & Plan:

## 2012-02-18 ENCOUNTER — Institutional Professional Consult (permissible substitution): Payer: 59 | Admitting: Pulmonary Disease

## 2012-03-23 ENCOUNTER — Encounter: Payer: Self-pay | Admitting: Pulmonary Disease

## 2012-03-23 ENCOUNTER — Ambulatory Visit (INDEPENDENT_AMBULATORY_CARE_PROVIDER_SITE_OTHER): Payer: 59 | Admitting: Pulmonary Disease

## 2012-03-23 ENCOUNTER — Ambulatory Visit (INDEPENDENT_AMBULATORY_CARE_PROVIDER_SITE_OTHER)
Admission: RE | Admit: 2012-03-23 | Discharge: 2012-03-23 | Disposition: A | Discharge status: Home / Self Care | Payer: 59 | Source: Ambulatory Visit | Attending: Pulmonary Disease | Admitting: Pulmonary Disease

## 2012-03-23 VITALS — BP 110/74 | HR 68 | Temp 97.8°F | Ht 63.0 in | Wt 250.0 lb

## 2012-03-23 DIAGNOSIS — R0609 Other forms of dyspnea: Secondary | ICD-10-CM

## 2012-03-23 DIAGNOSIS — R0989 Other specified symptoms and signs involving the circulatory and respiratory systems: Secondary | ICD-10-CM

## 2012-03-23 DIAGNOSIS — R0600 Dyspnea, unspecified: Secondary | ICD-10-CM

## 2012-03-23 DIAGNOSIS — R0683 Snoring: Secondary | ICD-10-CM

## 2012-03-23 NOTE — Assessment & Plan Note (Signed)
She has history of snoring.  She is not convinced that she has a sleeping problem otherwise, and is reluctant to undergo sleep testing.  Will arrange for overnight oximetry on room air to further assess, and this will also assist in evaluation of her dyspnea.

## 2012-03-23 NOTE — Assessment & Plan Note (Addendum)
This is likely related to obesity and deconditioning.  She does report intermittent wheezing.  To further assess will arrange for chest xray and pulmonary function testing.

## 2012-03-23 NOTE — Progress Notes (Deleted)
  Subjective:    Patient ID: Servando Salina, female    DOB: 03/13/1981, 31 y.o.   MRN: [id number]  HPI    Review of Systems  Constitutional: Negative for fever and unexpected weight change.  HENT: Negative for ear pain, nosebleeds, congestion, sore throat, rhinorrhea, sneezing, trouble swallowing, dental problem, postnasal drip and sinus pressure.   Eyes: Negative for redness and itching.  Respiratory: Negative for cough, chest tightness, shortness of breath and wheezing.   Cardiovascular: Negative for palpitations and leg swelling.  Gastrointestinal: Negative for nausea and vomiting.  Genitourinary: Negative for dysuria.  Musculoskeletal: Negative for joint swelling.  Skin: Negative for rash.  Neurological: Negative for headaches.  Hematological: Does not bruise/bleed easily.  Psychiatric/Behavioral: Negative for dysphoric mood. The patient is not nervous/anxious.        Objective:   Physical Exam        Assessment & Plan:

## 2012-03-23 NOTE — Progress Notes (Signed)
Chief Complaint  Patient presents with  . Sleep Consult    Referred by Dr. Yetta Barre    History of Present Illness: Tina Eaton is a 31 y.o. female for evaluation of snoring and dyspnea.  She was referred by her PCP for snoring.  She does not think she has a sleep problem.  Her main concern is with her dyspnea on exertion.  She wishes to address both these issues today.  She will get winded with exertion.  This happens sporadically.  She denies cough, but will get occasional wheeze.  She denies history of asthma, pneumonia, or cigarette use.  She gets occasional chest discomfort, but denies palpitations.  There is no history of leg swelling, or thrombo-embolic disease.  She noticed more trouble with her breathing after the birth of her second set of twins about 3 years ago.  She has been told that she snores.  She is not aware of having trouble with her breathing while asleep.  She goes to bed at 11 pm.  She falls asleep quickly.  She usually sleeps through the night.  She wakes up at 5 am.  She feels okay in the morning, and denies morning headaches.  She does not feel tired or sleepy during the day.  She is not using anything to help her sleep or stay awake.  The patient denies sleep walking, sleep talking, bruxism, or nightmares.  There is no history of restless legs.  The patient denies sleep hallucinations, sleep paralysis, or cataplexy.  Her Epworth score is 0 out of 24.   Tests:  Past Medical History  Diagnosis Date  . Anemia     Past Surgical History  Procedure Date  . Abdominal hysterectomy 04/15/2010  . Cesarean section 2004, 2011    Current Outpatient Prescriptions on File Prior to Visit  Medication Sig Dispense Refill  . Multiple Vitamin (MULTIVITAMIN) tablet Take 1 tablet by mouth daily.        No Known Allergies  Family History  Problem Relation Age of Onset  . Cancer Other   . Asthma Neg Hx   . Diabetes Neg Hx   . Early death Neg Hx   . Heart disease  Neg Hx   . Hyperlipidemia Neg Hx   . Hypertension Neg Hx   . Kidney disease Neg Hx   . Stroke Neg Hx     History  Substance Use Topics  . Smoking status: Never Smoker   . Smokeless tobacco: Never Used  . Alcohol Use: No    Review of Systems  Constitutional: Negative for fever and unexpected weight change.  HENT: Negative for ear pain, nosebleeds, congestion, sore throat, rhinorrhea, sneezing, trouble swallowing, dental problem, postnasal drip and sinus pressure.   Eyes: Negative for redness and itching.  Respiratory: Negative for cough, chest tightness, shortness of breath and wheezing.   Cardiovascular: Negative for palpitations and leg swelling.  Gastrointestinal: Negative for nausea and vomiting.  Genitourinary: Negative for dysuria.  Musculoskeletal: Negative for joint swelling.  Skin: Negative for rash.  Neurological: Negative for headaches.  Hematological: Does not bruise/bleed easily.  Psychiatric/Behavioral: Negative for dysphoric mood. The patient is not nervous/anxious.    Physical Exam: Filed Vitals:   03/23/12 1606  BP: 110/74  Pulse: 68  Temp: 97.8 F (36.6 C)  Height: 5\' 3"  (1.6 m)  Weight: 250 lb (113.399 kg)  SpO2: 98%  ,  Current Encounter SPO2  03/23/12 1606 98%  01/24/12 1035 99%    Wt Readings from  Last 3 Encounters:  03/23/12 250 lb (113.399 kg)  01/24/12 249 lb 8 oz (113.172 kg)    Body mass index is 44.29 kg/(m^2).   General - No distress ENT - No sinus tenderness, no oral exudate, MP3, enlarged tongue, no LAN, no thyromegaly, TM clear, pupils equal/reactive Cardiac - s1s2 regular, no murmur, pulses symmetric Chest - No wheeze/rales/dullness, good air entry, normal respiratory excursion Back - No focal tenderness Abd - Soft, non-tender, no organomegaly, + bowel sounds Ext - No edema Neuro - Normal strength, cranial nerves intact Skin - No rashes Psych - Normal mood, and behavior.   Lab Results  Component Value Date   WBC 5.5  01/24/2012   HGB 12.6 01/24/2012   HCT 38.2 01/24/2012   MCV 89.7 01/24/2012   PLT 211.0 01/24/2012    Lab Results  Component Value Date   CREATININE 0.7 01/24/2012   BUN 11 01/24/2012   NA 138 01/24/2012   K 4.2 01/24/2012   CL 106 01/24/2012   CO2 26 01/24/2012    Lab Results  Component Value Date   ALT 17 01/24/2012   AST 14 01/24/2012   ALKPHOS 53 01/24/2012   BILITOT 0.7 01/24/2012    Lab Results  Component Value Date   TSH 1.02 01/24/2012    BNP    Component Value Date/Time   PROBNP 8.0 01/24/2012 1148      Assessment/Plan:  Coralyn Helling, MD [place] [facility]/Sleep Pager:  [phone] 03/23/2012, 4:18 PM

## 2012-03-23 NOTE — Patient Instructions (Signed)
Chest xray today Will schedule breathing test (PFT) Will arrange for overnight oxygen test Follow up in 3 to 4 weeks

## 2012-03-24 ENCOUNTER — Telehealth: Payer: Self-pay | Admitting: Pulmonary Disease

## 2012-03-24 NOTE — Telephone Encounter (Signed)
Dg Chest 2 View  03/23/2012  *RADIOLOGY REPORT*  Clinical Data: Short of breath and chest pain  CHEST - 2 VIEW  Comparison: None.  Findings: Cardiac enlargement without heart failure.  Negative for infiltrate or effusion.  No mass is identified.  IMPRESSION: Cardiac enlargement without acute abnormality.   Original Report Authenticated By: Janeece Riggers, M.D.     Results d/w pt.  Explained that she has borderline cardiomegaly on CXR.  Will proceed with plan as detailed 03/23/12.  Depending on results will decide if she needs further evaluation with Echocardiogram.

## 2012-04-20 ENCOUNTER — Ambulatory Visit: Payer: 59 | Admitting: Pulmonary Disease

## 2012-04-21 ENCOUNTER — Ambulatory Visit: Payer: 59 | Admitting: Internal Medicine

## 2012-04-21 DIAGNOSIS — Z0289 Encounter for other administrative examinations: Secondary | ICD-10-CM

## 2012-05-07 ENCOUNTER — Telehealth: Payer: Self-pay | Admitting: Pulmonary Disease

## 2012-05-07 NOTE — Telephone Encounter (Signed)
ONO with RA 04/29/12 >> Test time 5 hrs 18 min.  Baseline SpO2 94%, low SpO2 87%.  Spent 12 sec with SpO2 < 88%.  ODI 7.5.  Will have my nurse inform pt that oxygen test is suggestive of sleep apnea breathing pattern.  Will discuss in more detail at next ROV on 05/13/12

## 2012-05-13 ENCOUNTER — Ambulatory Visit: Payer: 59 | Admitting: Pulmonary Disease

## 2012-05-13 NOTE — Telephone Encounter (Signed)
I spoke with patient about results and she verbalized understanding and had no questions 

## 2012-05-21 ENCOUNTER — Encounter: Payer: Self-pay | Admitting: Pulmonary Disease

## 2012-06-11 ENCOUNTER — Ambulatory Visit (INDEPENDENT_AMBULATORY_CARE_PROVIDER_SITE_OTHER): Payer: 59 | Admitting: Pulmonary Disease

## 2012-06-11 ENCOUNTER — Encounter: Payer: Self-pay | Admitting: Pulmonary Disease

## 2012-06-11 VITALS — BP 132/90 | HR 99 | Temp 97.6°F | Ht 64.0 in | Wt 250.0 lb

## 2012-06-11 DIAGNOSIS — R0600 Dyspnea, unspecified: Secondary | ICD-10-CM

## 2012-06-11 DIAGNOSIS — R0989 Other specified symptoms and signs involving the circulatory and respiratory systems: Secondary | ICD-10-CM

## 2012-06-11 DIAGNOSIS — R0683 Snoring: Secondary | ICD-10-CM

## 2012-06-11 DIAGNOSIS — J45909 Unspecified asthma, uncomplicated: Secondary | ICD-10-CM

## 2012-06-11 DIAGNOSIS — J4530 Mild persistent asthma, uncomplicated: Secondary | ICD-10-CM

## 2012-06-11 DIAGNOSIS — R0609 Other forms of dyspnea: Secondary | ICD-10-CM

## 2012-06-11 DIAGNOSIS — R0982 Postnasal drip: Secondary | ICD-10-CM

## 2012-06-11 HISTORY — DX: Postnasal drip: R09.82

## 2012-06-11 LAB — PULMONARY FUNCTION TEST

## 2012-06-11 MED ORDER — BECLOMETHASONE DIPROPIONATE 80 MCG/ACT IN AERS: 2.0000 | INHALATION_SPRAY | Freq: Two times a day (BID) | RESPIRATORY_TRACT | Status: DC

## 2012-06-11 MED ORDER — FLUTICASONE PROPIONATE 50 MCG/ACT NA SUSP: 2.0000 | Freq: Every day | NASAL | Status: DC

## 2012-06-11 MED ORDER — ALBUTEROL SULFATE HFA 108 (90 BASE) MCG/ACT IN AERS: 2.0000 | INHALATION_SPRAY | Freq: Four times a day (QID) | RESPIRATORY_TRACT | Status: DC | PRN

## 2012-06-11 NOTE — Progress Notes (Signed)
PFT done today. 

## 2012-06-11 NOTE — Progress Notes (Signed)
Chief Complaint  Patient presents with  . Follow-up    Review PFT.     History of Present Illness: Tina Eaton is a 32 y.o. female with snoring and dypsnea.  She is here to review PFT, CXR, and ONO.  She still has intermittent wheezing and continues to get winded with activity.  She gets chest tightness.  She has been getting sinus congestion with post-nasal drip.  TESTS: CXR 03/23/12 >> cardiac enlargement ONO with RA 04/29/12 >> Test time 5 hrs 18 min. Baseline SpO2 94%, low SpO2 87%. Spent 12 sec with SpO2 < 88%. ODI 7.5. PFT 06/11/12 >> FEV1 2.19 (75%), FEV1% 81, FEF 25-75 2.29 (67%), TLC 3.84 (75%), DLCO 65%, +BD from FEF 25-75   Tina Eaton  has a past medical history of Anemia; Mild persistent asthma (01/24/2012); and Post-nasal drip (06/11/2012).  Tina Eaton  has past surgical history that includes Abdominal hysterectomy (04/15/2010) and Cesarean section (2004, 2011).  Prior to Admission medications   Medication Sig Start Date End Date Taking? Authorizing Provider  Multiple Vitamin (MULTIVITAMIN) tablet Take 1 tablet by mouth daily.   Yes Historical Provider, MD    No Known Allergies   Physical Exam:  General - No distress ENT - No sinus tenderness, no oral exudate, no LAN Cardiac - s1s2 regular, no murmur Chest - No wheeze/rales/dullness Back - No focal tenderness Abd - Soft, non-tender Ext - No edema Neuro - Normal strength Skin - No rashes Psych - normal mood, and behavior   Assessment/Plan:  Tina Helling, MD [place] [facility]Critical Care/Sleep Pager:  [phone] 06/11/2012, 1:50 PM

## 2012-06-11 NOTE — Assessment & Plan Note (Signed)
This is likely contributing to her respiratory symptoms.  Will give her trial of flonase.

## 2012-06-11 NOTE — Assessment & Plan Note (Signed)
Her PFT's are suggestive of asthma.  She has intermittent wheezing associated with her dyspnea.  She felt clinical benefit from bronchodilator therapy during PFT.  Will start Qvar and prn proair.

## 2012-06-11 NOTE — Assessment & Plan Note (Signed)
Will defer further assessment for sleep apnea until her asthma and rhinitis are better controlled.

## 2012-06-11 NOTE — Patient Instructions (Signed)
Qvar two puffs twice per day, and rinse mouth after each use Proair two puffs as needed for cough, wheeze, or chest congestion Flonase two sprays each nostril daily Follow up in 4 weeks

## 2012-06-25 ENCOUNTER — Encounter: Payer: Self-pay | Admitting: Pulmonary Disease

## 2012-07-08 ENCOUNTER — Encounter: Payer: Self-pay | Admitting: Pulmonary Disease

## 2012-07-08 ENCOUNTER — Ambulatory Visit (INDEPENDENT_AMBULATORY_CARE_PROVIDER_SITE_OTHER): Payer: 59 | Admitting: Pulmonary Disease

## 2012-07-08 VITALS — BP 106/82 | HR 76 | Temp 97.8°F | Ht 63.0 in | Wt 253.4 lb

## 2012-07-08 DIAGNOSIS — R0609 Other forms of dyspnea: Secondary | ICD-10-CM

## 2012-07-08 DIAGNOSIS — J4530 Mild persistent asthma, uncomplicated: Secondary | ICD-10-CM

## 2012-07-08 DIAGNOSIS — J45909 Unspecified asthma, uncomplicated: Secondary | ICD-10-CM

## 2012-07-08 DIAGNOSIS — R0982 Postnasal drip: Secondary | ICD-10-CM

## 2012-07-08 DIAGNOSIS — R0683 Snoring: Secondary | ICD-10-CM

## 2012-07-08 DIAGNOSIS — R0989 Other specified symptoms and signs involving the circulatory and respiratory systems: Secondary | ICD-10-CM

## 2012-07-08 MED ORDER — BECLOMETHASONE DIPROPIONATE 80 MCG/ACT IN AERS: 1.0000 | INHALATION_SPRAY | Freq: Two times a day (BID) | RESPIRATORY_TRACT | Status: DC

## 2012-07-08 NOTE — Progress Notes (Signed)
Chief Complaint  Patient presents with  . Follow-up    Pt states her breathing has been okay. had some wheezing last week. no chest tx, no cough. Pt has no complaints today. She can't really tell if the QVAR has helped or not. She has not had to use her rescue inhaler at all.     History of Present Illness: Tina Eaton is a 32 y.o. female with snoring, mild/persistent asthma, and post nasal drip.  She is sleeping better, and not snoring as much.  Her sinuses are better with flonase.  She is not sure how much Qvar is helping.  She is not wheezing as much, and not having cough or congestion.  She has not needed to use proair.  TESTS: CXR 03/23/12 >> cardiac enlargement ONO with RA 04/29/12 >> Test time 5 hrs 18 min. Baseline SpO2 94%, low SpO2 87%. Spent 12 sec with SpO2 < 88%. ODI 7.5. PFT 06/11/12 >> FEV1 2.19 (75%), FEV1% 81, FEF 25-75 2.29 (67%), TLC 3.84 (75%), DLCO 65%, +BD from FEF 25-75   Tina Eaton  has a past medical history of Anemia; Mild persistent asthma (01/24/2012); and Post-nasal drip (06/11/2012).  Tina Eaton  has past surgical history that includes Abdominal hysterectomy (04/15/2010) and Cesarean section (2004, 2011).  Prior to Admission medications   Medication Sig Start Date End Date Taking? Authorizing Provider  Multiple Vitamin (MULTIVITAMIN) tablet Take 1 tablet by mouth daily.   Yes Historical Provider, MD    No Known Allergies   Physical Exam:  General - No distress ENT - No sinus tenderness, no oral exudate, no LAN Cardiac - s1s2 regular, no murmur Chest - No wheeze/rales/dullness Back - No focal tenderness Abd - Soft, non-tender Ext - No edema Neuro - Normal strength Skin - No rashes Psych - normal mood, and behavior   Assessment/Plan:  Coralyn Helling, MD [place] [facility]Critical Care/Sleep Pager:  [phone] 07/08/2012, 4:26 PM

## 2012-07-08 NOTE — Assessment & Plan Note (Signed)
She is sleeping better, and her upper airway looks better on exam.  Likely had some degree of upper airway irritation from post-nasal drip, which is better.  Will monitor clinically for now.

## 2012-07-08 NOTE — Assessment & Plan Note (Signed)
She is doing better, and not sure how much Qvar is helping.  Advised her to gradually decrease her use of Qvar and monitor her symptoms.  She can continue proair as needed.

## 2012-07-08 NOTE — Patient Instructions (Signed)
Change Qvar to one puff twice per day for 2 weeks >> if breathing okay, then can try stopping Qvar Proair two puffs as needed for cough, wheeze, or chest congestion Continue flonase two sprays each nostril  Follow up in 4 months

## 2012-07-08 NOTE — Assessment & Plan Note (Signed)
Improved with flonase 

## 2012-10-02 ENCOUNTER — Telehealth: Payer: Self-pay | Admitting: Pulmonary Disease

## 2012-10-02 NOTE — Telephone Encounter (Signed)
°  Called patient and left messages x3. Sent letter 10/02/12

## 2014-01-11 IMAGING — CR DG CHEST 2V
2 series · 2 of 2 positions shown · non-contrast
Comparison: None.

CLINICAL DATA: Short of breath and chest pain

CHEST - 2 VIEW

[view not recorded (1 of 2)]
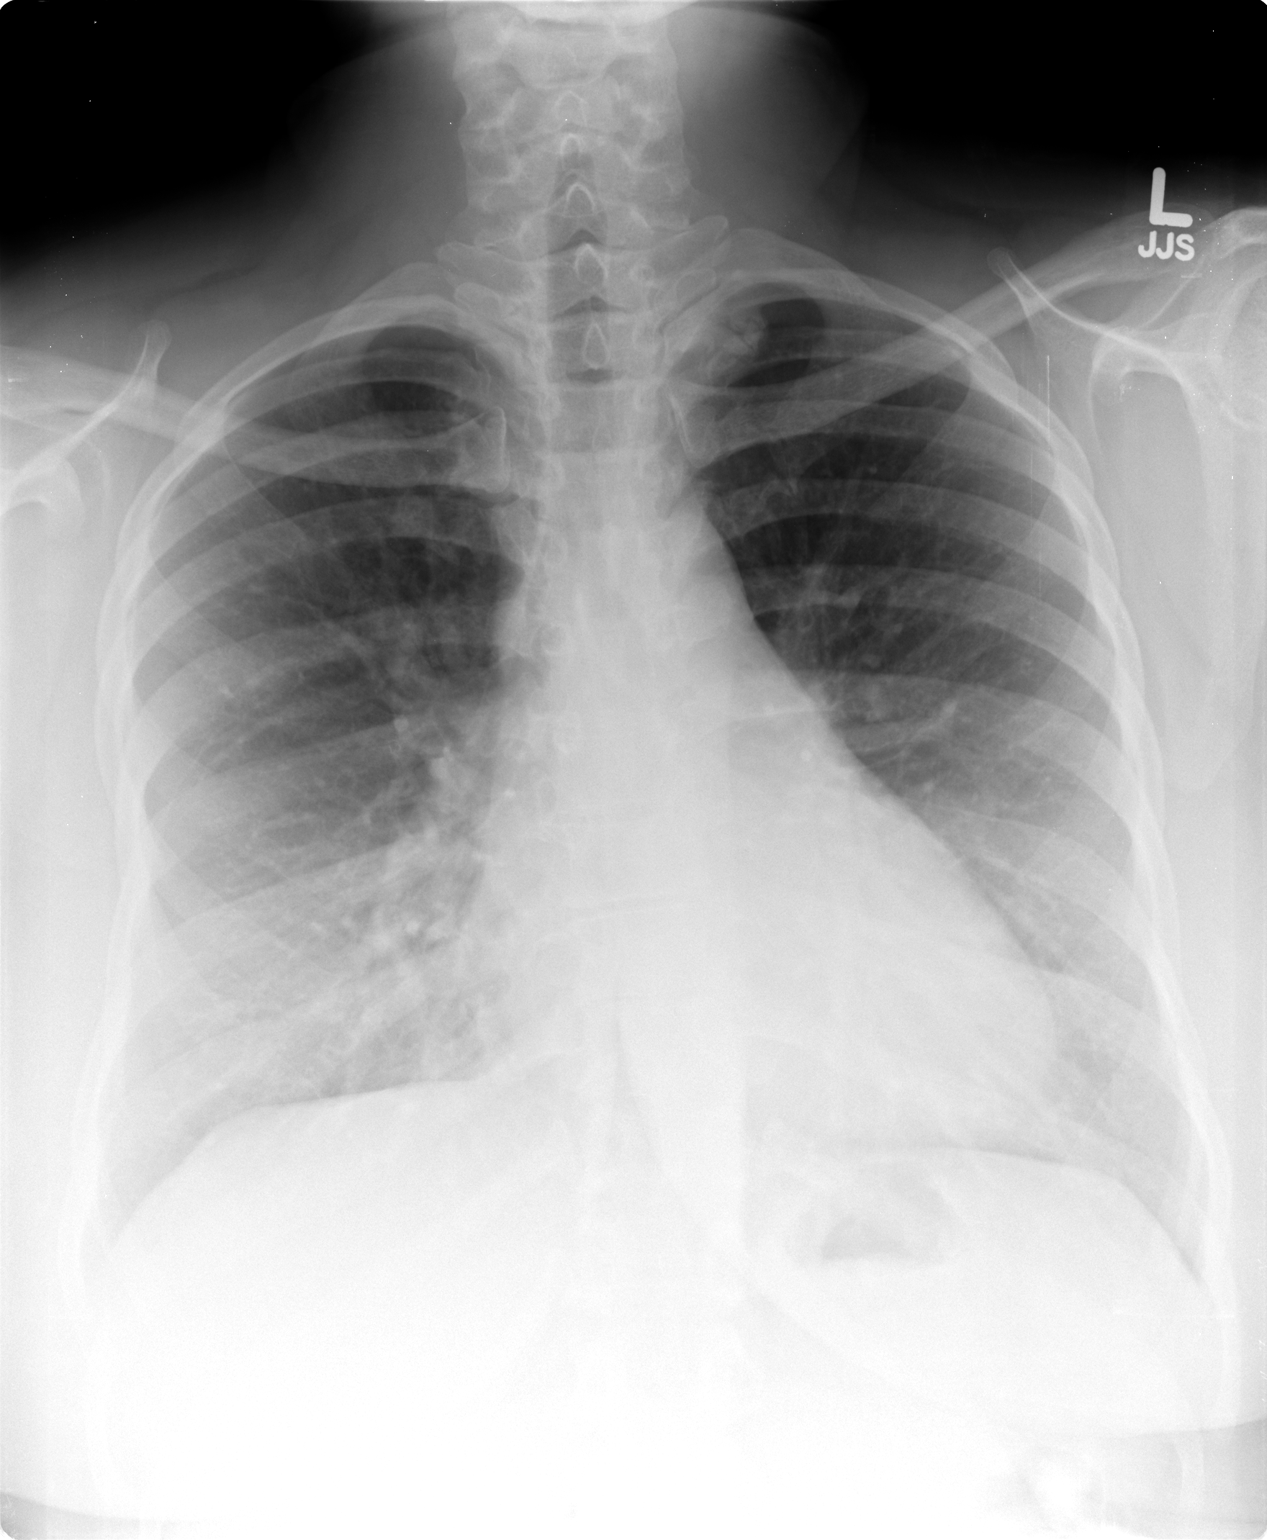

[view not recorded (2 of 2)]
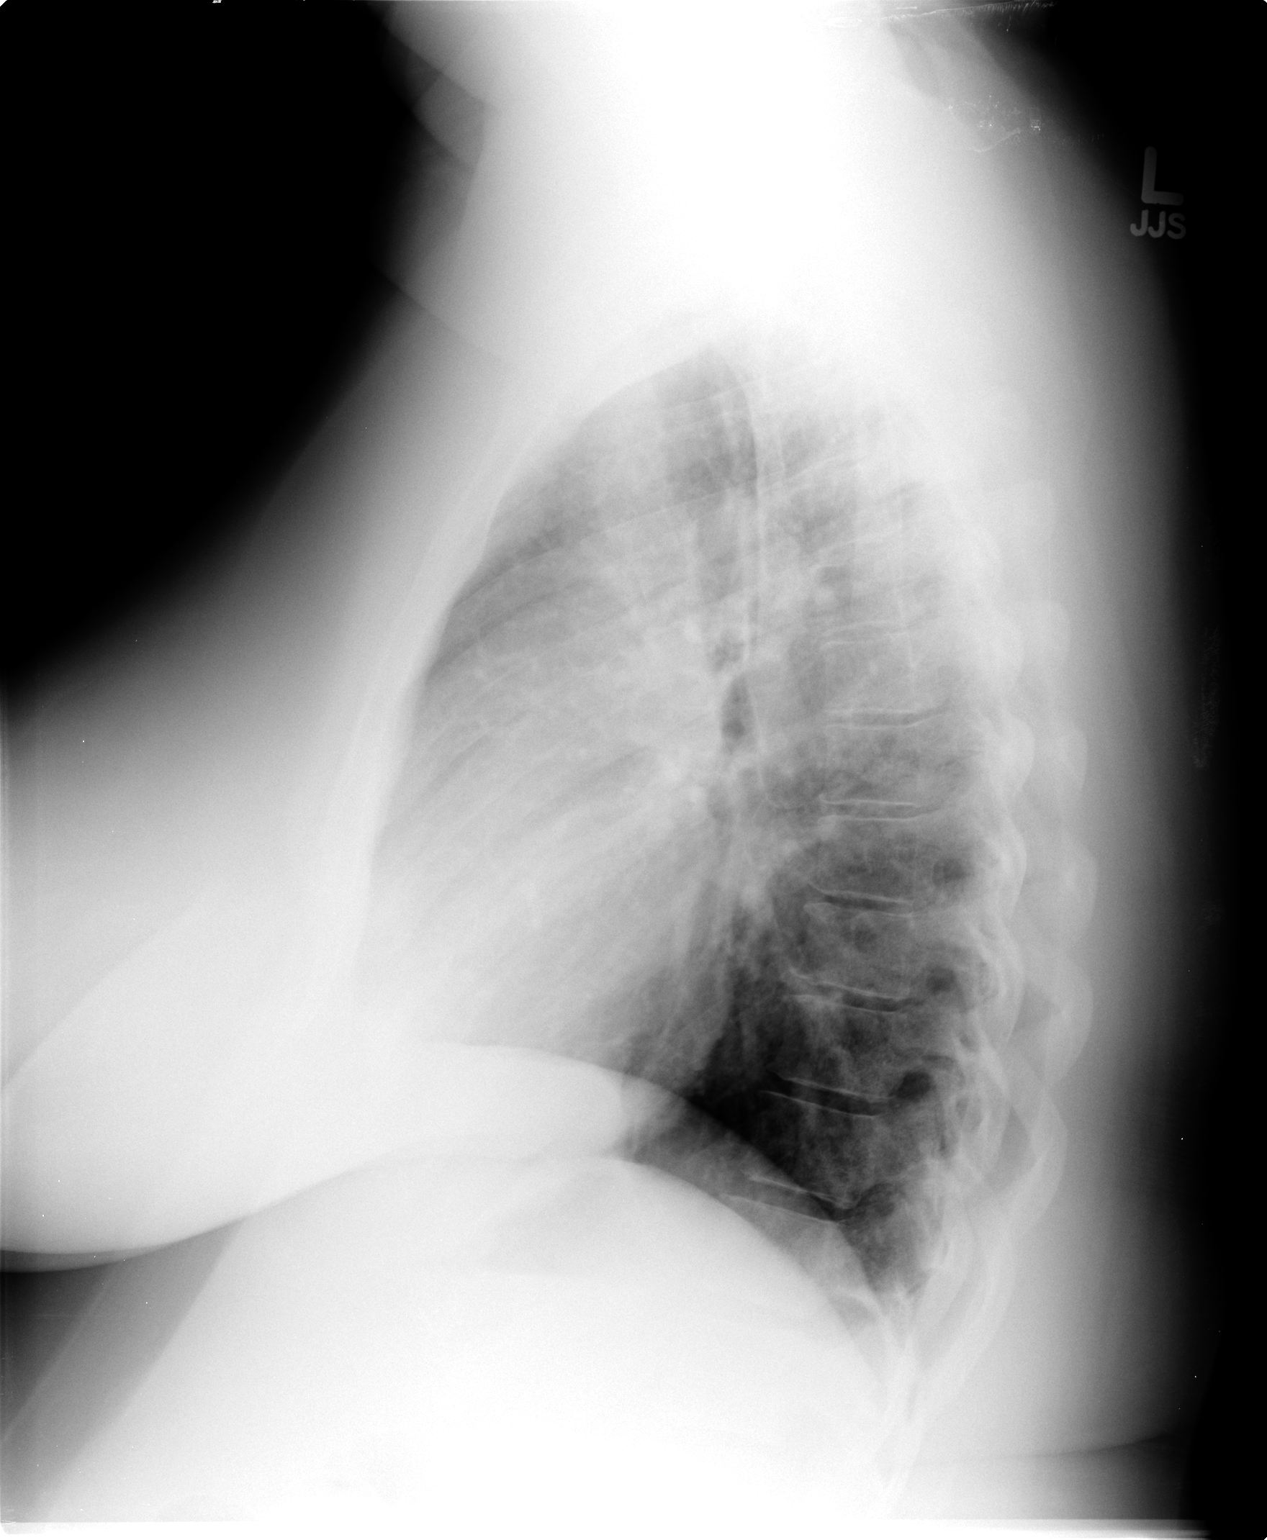

[2 of 2 positions shown; findings below may reference images not displayed]

FINDINGS: Cardiac enlargement without heart failure.  Negative for
infiltrate or effusion.  No mass is identified.
IMPRESSION: Cardiac enlargement without acute abnormality.

## 2014-03-07 ENCOUNTER — Other Ambulatory Visit: Payer: Self-pay | Admitting: Obstetrics and Gynecology

## 2014-03-09 ENCOUNTER — Other Ambulatory Visit (HOSPITAL_COMMUNITY): Payer: Self-pay | Admitting: Obstetrics and Gynecology

## 2014-03-09 DIAGNOSIS — I889 Nonspecific lymphadenitis, unspecified: Secondary | ICD-10-CM

## 2014-03-09 DIAGNOSIS — R102 Pelvic and perineal pain: Secondary | ICD-10-CM

## 2014-03-11 LAB — CYTOLOGY - PAP

## 2014-03-15 ENCOUNTER — Ambulatory Visit (HOSPITAL_COMMUNITY)
Admission: RE | Admit: 2014-03-15 | Discharge: 2014-03-15 | Disposition: A | Discharge status: Home / Self Care | Payer: 59 | Source: Ambulatory Visit | Attending: Obstetrics and Gynecology | Admitting: Obstetrics and Gynecology

## 2014-03-15 DIAGNOSIS — R102 Pelvic and perineal pain: Secondary | ICD-10-CM

## 2014-03-18 ENCOUNTER — Other Ambulatory Visit: Payer: Self-pay | Admitting: Obstetrics and Gynecology

## 2014-03-18 ENCOUNTER — Other Ambulatory Visit (HOSPITAL_COMMUNITY): Payer: Self-pay | Admitting: Obstetrics and Gynecology

## 2014-03-18 DIAGNOSIS — N949 Unspecified condition associated with female genital organs and menstrual cycle: Secondary | ICD-10-CM

## 2014-03-21 ENCOUNTER — Ambulatory Visit (HOSPITAL_COMMUNITY)
Admission: RE | Admit: 2014-03-21 | Discharge: 2014-03-21 | Disposition: A | Discharge status: Home / Self Care | Payer: 59 | Source: Ambulatory Visit | Attending: Obstetrics and Gynecology | Admitting: Obstetrics and Gynecology

## 2014-03-22 ENCOUNTER — Ambulatory Visit (HOSPITAL_COMMUNITY)
Admission: RE | Admit: 2014-03-22 | Discharge: 2014-03-22 | Disposition: A | Discharge status: Home / Self Care | Payer: 59 | Source: Ambulatory Visit | Attending: Obstetrics and Gynecology | Admitting: Obstetrics and Gynecology

## 2014-03-22 DIAGNOSIS — N949 Unspecified condition associated with female genital organs and menstrual cycle: Secondary | ICD-10-CM | POA: Diagnosis not present

## 2014-03-22 MED ORDER — GADOBENATE DIMEGLUMINE 529 MG/ML IV SOLN
20.0000 mL | Freq: Once | INTRAVENOUS | Status: AC
  Administered 2014-03-22: 20 mL via INTRAVENOUS

## 2014-03-25 ENCOUNTER — Encounter: Payer: Self-pay | Admitting: Gynecology

## 2014-03-25 ENCOUNTER — Ambulatory Visit: Payer: 59 | Attending: Gynecologic Oncology | Admitting: Gynecology

## 2014-03-25 VITALS — BP 123/73 | HR 77 | Temp 98.8°F | Resp 20 | Ht 63.0 in | Wt 248.8 lb

## 2014-03-25 DIAGNOSIS — R1909 Other intra-abdominal and pelvic swelling, mass and lump: Secondary | ICD-10-CM | POA: Diagnosis not present

## 2014-03-25 DIAGNOSIS — J45909 Unspecified asthma, uncomplicated: Secondary | ICD-10-CM | POA: Insufficient documentation

## 2014-03-25 DIAGNOSIS — N9489 Other specified conditions associated with female genital organs and menstrual cycle: Secondary | ICD-10-CM

## 2014-03-25 DIAGNOSIS — Z90710 Acquired absence of both cervix and uterus: Secondary | ICD-10-CM

## 2014-03-25 DIAGNOSIS — Z7951 Long term (current) use of inhaled steroids: Secondary | ICD-10-CM | POA: Insufficient documentation

## 2014-03-25 DIAGNOSIS — Z79899 Other long term (current) drug therapy: Secondary | ICD-10-CM | POA: Insufficient documentation

## 2014-03-25 DIAGNOSIS — N8320 Unspecified ovarian cysts: Secondary | ICD-10-CM

## 2014-03-25 NOTE — Patient Instructions (Addendum)
Plan for surgery the morning of Jan 19 with Dr. Denman George at [facility].                Preparing for your Surgery  Pre-operative Testing -You will receive a phone call from presurgical testing at [facility] to arrange for a pre-operative testing appointment before your surgery.  This appointment normally occurs one to two weeks before your scheduled surgery.   -Bring your insurance card, copy of an advanced directive if applicable, medication list  -At that visit, you will be asked to sign a consent for a possible blood transfusion in case a transfusion becomes necessary during surgery.  The need for a blood transfusion is rare but having consent is a necessary part of your care.     -You should not be taking blood thinners or aspirin at least ten days prior to surgery unless instructed by your surgeon.  Day Before Surgery at [place] will be asked to take in only clear liquids the day before surgery.  Examples of clear liquids include broths, jello, and clear juices.  You may also be advised to perform a Miralax bowel prep the day before your surgery based off of your provider's recommendations.  You will be advised to have nothing to eat or drink after midnight the evening before.    Your role in recovery Your role is to become active as soon as directed by your doctor, while still giving yourself time to heal.  Rest when you feel tired. You will be asked to do the following in order to speed your recovery:  - Cough and breathe deeply. This helps toclear and expand your lungs and can prevent pneumonia. You may be given a spirometer to practice deep breathing. A staff member will show you how to use the spirometer. - Do mild physical activity. Walking or moving your legs help your circulation and body functions return to normal. A staff member will help you when you try to walk and will provide you with simple exercises. Do not try to get up or walk alone the first time. - Actively  manage your pain. Managing your pain lets you move in comfort. We will ask you to rate your pain on a scale of zero to 10. It is your responsibility to tell your doctor or nurse where and how much you hurt so your pain can be treated.  Special Considerations -If you are diabetic, you may be placed on insulin after surgery to have closer control over your blood sugars to promote healing and recovery.  This does not mean that you will be discharged on insulin.  If applicable, your oral antidiabetics will be resumed when you are tolerating a solid diet.  -Your final pathology results from surgery should be available by the Friday after surgery and the results will be relayed to you when available.  Blood Transfusion Information WHAT IS A BLOOD TRANSFUSION? A transfusion is the replacement of blood or some of its parts. Blood is made up of multiple cells which provide different functions.  Red blood cells carry oxygen and are used for blood loss replacement.  White blood cells fight against infection.  Platelets control bleeding.  Plasma helps clot blood.  Other blood products are available for specialized needs, such as hemophilia or other clotting disorders. BEFORE THE TRANSFUSION  Who gives blood for transfusions?   You may be able to donate blood to be used at a later date on yourself (autologous donation).  Relatives can be asked to donate blood. This is generally not any safer than if you have received blood from a stranger. The same precautions are taken to ensure safety when a relative's blood is donated.  Healthy volunteers who are fully evaluated to make sure their blood is safe. This is blood bank blood. Transfusion therapy is the safest it has ever been in the practice of medicine. Before blood is taken from a donor, a complete history is taken to make sure that person has no history of diseases nor engages in risky social behavior (examples are intravenous drug use or sexual  activity with multiple partners). The donor's travel history is screened to minimize risk of transmitting infections, such as malaria. The donated blood is tested for signs of infectious diseases, such as HIV and hepatitis. The blood is then tested to be sure it is compatible with you in order to minimize the chance of a transfusion reaction. If you or a relative donates blood, this is often done in anticipation of surgery and is not appropriate for emergency situations. It takes many days to process the donated blood. RISKS AND COMPLICATIONS Although transfusion therapy is very safe and saves many lives, the main dangers of transfusion include:   Getting an infectious disease.  Developing a transfusion reaction. This is an allergic reaction to something in the blood you were given. Every precaution is taken to prevent this. The decision to have a blood transfusion has been considered carefully by your caregiver before blood is given. Blood is not given unless the benefits outweigh the risks.

## 2014-03-25 NOTE — Progress Notes (Signed)
Consult Note: Gyn-Onc   Tina Eaton 33 y.o. female  Chief Complaint  Patient presents with  . adnexal mass    Assessment : Bilateral pelvic cysts. On the left this appears to be a left ovarian cyst. On the right the multicystic area is deep in the pelvis and to the right of the rectum. I presume this is the ovary that for some reason is deep in the pelvis although some other pathologic process is certainly possible. I see no evidence of malignancy.  Plan: I would recommend surgical exploration and resection of both masses. If possible we will attempt to preserve some ovarian tissue on the left if at all possible. The patient is aware that the pelvic mass deep in the eldest to the right of the rectum will be a more difficult surgical procedure and the risks of rectal injury are emphasized as well as the risk of wound infection given her obesity. The patient is aware of the other risks of surgery and wishes to proceed. We will try to find operating time in early January area  All questions are answered. I have shown the patient the images from her MRI.  HPI: 33 year old seen in consultation request of Dr. Gaetano Net regarding the management of a newly diagnosed pelvic masses. The patient presented with abdominal pain and imaging including ultrasound and MRI been performed. The MRI reveals a ovoid cystic lesion in the left adnexa measuring 8.5 x 5 x 4.3 cm. There are no septations or nodularity. In addition there is a complex cystic lesion centered in the right perirectal presacral space posterior to the vaginal cuff measuring 7.2 x 8.1 x 7.8 cm. It displaces the rectum to the left there were no solid areas or nodules although there are's thick septations to this mass. Aside from pain the patient denies any other symptoms.  Patient has a past history of undergoing a laparoscopically assisted vaginal hysterectomy in June 2012 for uterine fibroids. The uterus weighed 235 g. Patient is also had 2  prior cesarean sections.  She denies any significant past medical history.  Review of Systems:10 point review of systems is negative except as noted in interval history.   Vitals: Blood pressure 123/73, pulse 77, temperature 98.8 F (37.1 C), temperature source Oral, resp. rate 20, height 5\' 3"  (1.6 m), weight 248 lb 12.8 oz (112.855 kg), last menstrual period 08/14/2010.  Physical Exam: General : The patient is a healthy woman in no acute distress.  HEENT: normocephalic, extraoccular movements normal; neck is supple without thyromegally  Lynphnodes: Supraclavicular and inguinal nodes not enlarged  Abdomen: Soft, non-tender, no ascites, no organomegally, no masses, no hernias  Pelvic:  EGBUS: Normal female  Vagina: Normal, no lesions  Urethra and Bladder: Normal, non-tender  Cervix: Surgically absent  Uterus: Surgically absent  Bi-manual examination: Non-tender; no adenxal masses or nodularity  Rectal: normal sphincter tone, no masses, no blood  Lower extremities: No edema or varicosities. Normal range of motion      Allergies  Allergen Reactions  . Latex Itching    Itching and redness to affected site    Past Medical History  Diagnosis Date  . Anemia   . Mild persistent asthma 01/24/2012  . Post-nasal drip 06/11/2012    Past Surgical History  Procedure Laterality Date  . Abdominal hysterectomy  04/15/2010  . Cesarean section  2004, 2011    Current Outpatient Prescriptions  Medication Sig Dispense Refill  . OVER THE COUNTER MEDICATION Take 2 tablets by mouth daily.    Marland Kitchen  albuterol (PROAIR HFA) 108 (90 BASE) MCG/ACT inhaler Inhale 2 puffs into the lungs every 6 (six) hours as needed for wheezing or shortness of breath. (Patient not taking: Reported on 03/25/2014) 1 Inhaler 5  . beclomethasone (QVAR) 80 MCG/ACT inhaler Inhale 1 puff into the lungs 2 (two) times daily. (Patient not taking: Reported on 03/25/2014) 1 Inhaler 5  . fluticasone (FLONASE) 50 MCG/ACT nasal  spray Place 2 sprays into the nose daily. (Patient not taking: Reported on 03/25/2014) 16 g 2  . Multiple Vitamin (MULTIVITAMIN) tablet Take 1 tablet by mouth daily.     No current facility-administered medications for this visit.    History   Social History  . Marital Status: Single    Spouse Name: N/A    Number of Children: N/A  . Years of Education: N/A   Occupational History  . Not on file.   Social History Main Topics  . Smoking status: Never Smoker   . Smokeless tobacco: Never Used  . Alcohol Use: No  . Drug Use: No  . Sexual Activity: Not Currently    Birth Control/ Protection: Surgical   Other Topics Concern  . Not on file   Social History Narrative    Family History  Problem Relation Age of Onset  . Cancer Other   . Asthma Neg Hx   . Diabetes Neg Hx   . Early death Neg Hx   . Heart disease Neg Hx   . Hyperlipidemia Neg Hx   . Hypertension Neg Hx   . Kidney disease Neg Hx   . Stroke Neg Hx       CLARKE-PEARSON,Oswaldo Cueto L, MD 03/25/2014, 1:35 PM

## 2014-03-30 ENCOUNTER — Ambulatory Visit: Payer: 59 | Admitting: Gynecologic Oncology

## 2014-03-31 ENCOUNTER — Telehealth: Payer: Self-pay | Admitting: *Deleted

## 2014-03-31 ENCOUNTER — Other Ambulatory Visit: Payer: Self-pay | Admitting: *Deleted

## 2014-03-31 DIAGNOSIS — R1900 Intra-abdominal and pelvic swelling, mass and lump, unspecified site: Secondary | ICD-10-CM

## 2014-03-31 NOTE — Telephone Encounter (Addendum)
Rescheduled patient's surgery to 06/07/14. Pt aware of this as well.

## 2014-03-31 NOTE — Telephone Encounter (Addendum)
Pt called asking if she could have another ultrasound to evaluate the cysts prior to surgery in case they have gotten smaller. Per Joylene John, NP, order for pelvic ultrasound placed. Called pt with appt for [place] on 05/09/14 and an appt with Dr. Denman George scheduled for 05/13/14 now. Told pt to arrive for ultrasound at 12:45pm with a full bladder (she needs to drink at least 32 oz water before arriving) - pt agreeable to this.

## 2014-04-11 ENCOUNTER — Telehealth: Payer: Self-pay | Admitting: *Deleted

## 2014-04-11 NOTE — Telephone Encounter (Signed)
Patient called stating that she spoke with [employer] and they have not received the forms needed for her short term disability. Told patient that since she is having an ultrasound on 05/09/14 to evaluate the cyst we cannot fill out the forms until we have a definitive date for surgery. Patient states understanding.

## 2014-04-18 ENCOUNTER — Ambulatory Visit: Payer: 59 | Admitting: Gynecologic Oncology

## 2014-05-09 ENCOUNTER — Ambulatory Visit (HOSPITAL_COMMUNITY)
Admission: RE | Admit: 2014-05-09 | Discharge: 2014-05-09 | Disposition: A | Discharge status: Home / Self Care | Payer: 59 | Source: Ambulatory Visit | Attending: Gynecologic Oncology | Admitting: Gynecologic Oncology

## 2014-05-09 DIAGNOSIS — N8329 Other ovarian cysts: Secondary | ICD-10-CM | POA: Diagnosis not present

## 2014-05-09 DIAGNOSIS — Z90710 Acquired absence of both cervix and uterus: Secondary | ICD-10-CM | POA: Insufficient documentation

## 2014-05-09 DIAGNOSIS — R1900 Intra-abdominal and pelvic swelling, mass and lump, unspecified site: Secondary | ICD-10-CM

## 2014-05-13 ENCOUNTER — Ambulatory Visit: Payer: 59 | Attending: Gynecologic Oncology | Admitting: Gynecologic Oncology

## 2014-05-13 ENCOUNTER — Encounter: Payer: Self-pay | Admitting: Gynecologic Oncology

## 2014-05-13 VITALS — BP 148/72 | HR 88 | Temp 98.2°F | Resp 20 | Ht 63.0 in | Wt 252.8 lb

## 2014-05-13 DIAGNOSIS — Z90710 Acquired absence of both cervix and uterus: Secondary | ICD-10-CM | POA: Diagnosis not present

## 2014-05-13 DIAGNOSIS — Z91040 Latex allergy status: Secondary | ICD-10-CM | POA: Diagnosis not present

## 2014-05-13 DIAGNOSIS — J45909 Unspecified asthma, uncomplicated: Secondary | ICD-10-CM | POA: Insufficient documentation

## 2014-05-13 DIAGNOSIS — N8320 Unspecified ovarian cysts: Secondary | ICD-10-CM | POA: Diagnosis not present

## 2014-05-13 DIAGNOSIS — N9489 Other specified conditions associated with female genital organs and menstrual cycle: Secondary | ICD-10-CM

## 2014-05-13 DIAGNOSIS — N949 Unspecified condition associated with female genital organs and menstrual cycle: Secondary | ICD-10-CM

## 2014-05-13 DIAGNOSIS — D649 Anemia, unspecified: Secondary | ICD-10-CM | POA: Diagnosis not present

## 2014-05-13 DIAGNOSIS — R229 Localized swelling, mass and lump, unspecified: Secondary | ICD-10-CM | POA: Diagnosis present

## 2014-05-13 DIAGNOSIS — N83202 Unspecified ovarian cyst, left side: Secondary | ICD-10-CM

## 2014-05-13 DIAGNOSIS — N83209 Unspecified ovarian cyst, unspecified side: Secondary | ICD-10-CM | POA: Insufficient documentation

## 2014-05-13 NOTE — Progress Notes (Signed)
Followup Note: Gyn-Onc   Tina Eaton 34 y.o. female  Chief Complaint  Patient presents with  . adnexal mass    Assessment : Pelvic cysts, likely benign. I reviewed the patient's imaging films including her MRI and Ultrasound from December 2015 and her [place] which was repeated last week. I see no evidence of malignancy. The perirectal cyst has either resolved or it was not visible on this ultrasound. The left ovarian cyst remain present. Her symptoms of abdominal pain have resolved. The patient is reluctant to undergo surgery.   Plan: I offered repeat visualization of these with repeat ultrasound in 3 months, and reconsideration of either surgical exploration or ongoing observation. I discussed that if her symptoms return in the interim she should notify us so that we can schedule follow-up imaging early out or proceed to immediately with surgery. I discussed the potential for rectal compression and difficulty with passage of stools or constipation as a potential symptom that the cysts might pose. I discussed that I'm not able to definitively rule out malignancy until surgical removal however overall my impression is that these are likely benign phenomenon and the patient is more concerned about surgical risk and recovery then the symptoms these masses create or the risk of failed diagnosis of malignancy.    She will return to see me in April after her follow-up ultrasound.  HPI: 34 year old seen in consultation request of Dr. Gaetano Net regarding the management of a newly diagnosed pelvic masses. The patient presented with abdominal pain and imaging including ultrasound and MRI been performed. The MRI reveals a ovoid cystic lesion in the left adnexa measuring 8.5 x 5 x 4.3 cm. There are no septations or nodularity. In addition there is a complex cystic lesion centered in the right perirectal presacral space posterior to the vaginal cuff measuring 7.2 x 8.1 x 7.8 cm. It displaces the rectum to the  left there were no solid areas or nodules although there are's thick septations to this mass. Aside from pain the patient denies any other symptoms.  co-she was worked reluctant about undergoing surgery she canceled her January surgical date answered requested a follow-up ultrasound scan which was performed on 05/09/2014. It showed a left ovary measuring 3.7 x 2.6 x 3.1 cm with an associated left ovarian cyst measuring 7.6 x 3.9 x 3.1 cm. It had not not changed significantly to the prior study. It did not appear complex. There was no evidence of free fluid or solid adnexal masses. The right perirectal cyst identified previously was not visualized. The right ovary was normal in appearance measuring 3 x 2.5 x 1.8 cm  Patient has a past history of undergoing a laparoscopically assisted vaginal hysterectomy in June 2012 for uterine fibroids. The uterus weighed 235 g. Patient is also had 2 prior cesarean sections.  She denies any significant past medical history.  Review of Systems:10 point review of systems is negative except as noted in interval history.   Vitals: Blood pressure 148/72, pulse 88, temperature 98.2 F (36.8 C), temperature source Oral, resp. rate 20, height 5\' 3"  (1.6 m), weight 252 lb 12.8 oz (114.669 kg), last menstrual period 08/14/2010.  Physical Exam: General : The patient is a healthy woman in no acute distress.  HEENT: normocephalic, extraoccular movements normal; neck is supple without thyromegally  Lynphnodes: Supraclavicular and inguinal nodes not enlarged  Abdomen: Soft, non-tender, no ascites, no organomegally, no masses, no hernias  Pelvic:  EGBUS: Normal female  Vagina: Normal, no lesions  Urethra  and Bladder: Normal, non-tender  Cervix: Surgically absent  Uterus: Surgically absent  Bi-manual examination: Non-tender; smooth fullness in the left adnexal region and perirectal space palpable vaginally more so than on rectal exam rectal: normal sphincter tone, no masses,  no blood  Lower extremities: No edema or varicosities. Normal range of motion      Allergies  Allergen Reactions  . Latex Itching    Itching and redness to affected site    Past Medical History  Diagnosis Date  . Anemia   . Mild persistent asthma 01/24/2012  . Post-nasal drip 06/11/2012    Past Surgical History  Procedure Laterality Date  . Abdominal hysterectomy  04/15/2010  . Cesarean section  2004, 2011    Current Outpatient Prescriptions  Medication Sig Dispense Refill  . Multiple Vitamin (MULTIVITAMIN) tablet Take 1 tablet by mouth daily.    . fluticasone (FLONASE) 50 MCG/ACT nasal spray Place 2 sprays into the nose daily. (Patient not taking: Reported on 03/25/2014) 16 g 2   No current facility-administered medications for this visit.    History   Social History  . Marital Status: Single    Spouse Name: N/A    Number of Children: N/A  . Years of Education: N/A   Occupational History  . Not on file.   Social History Main Topics  . Smoking status: Never Smoker   . Smokeless tobacco: Never Used  . Alcohol Use: No  . Drug Use: No  . Sexual Activity: Yes    Birth Control/ Protection: Surgical   Other Topics Concern  . Not on file   Social History Narrative    Family History  Problem Relation Age of Onset  . Cancer Other   . Asthma Neg Hx   . Diabetes Neg Hx   . Early death Neg Hx   . Heart disease Neg Hx   . Hyperlipidemia Neg Hx   . Hypertension Neg Hx   . Kidney disease Neg Hx   . Stroke Neg Hx       Donaciano Eva, MD 05/13/2014, 5:08 PM

## 2014-05-13 NOTE — Patient Instructions (Signed)
Followup with ultrasound in April 2016 and appt with Dr. Denman George. Please call us sooner with any issues or concerns.

## 2014-05-26 ENCOUNTER — Ambulatory Visit: Admit: 2014-05-26 | Payer: Self-pay | Admitting: Obstetrics and Gynecology

## 2014-05-26 SURGERY — LAPAROSCOPY, DIAGNOSTIC
Anesthesia: General

## 2014-06-07 ENCOUNTER — Inpatient Hospital Stay (HOSPITAL_COMMUNITY): Admission: RE | Admit: 2014-06-07 | Payer: 59 | Source: Ambulatory Visit | Admitting: Gynecology

## 2014-06-07 ENCOUNTER — Encounter (HOSPITAL_COMMUNITY): Admission: RE | Payer: Self-pay | Source: Ambulatory Visit

## 2014-06-07 SURGERY — LAPAROTOMY, EXPLORATORY
Anesthesia: General | Laterality: Left

## 2014-08-10 ENCOUNTER — Ambulatory Visit (HOSPITAL_COMMUNITY)
Admission: RE | Admit: 2014-08-10 | Discharge: 2014-08-10 | Disposition: A | Discharge status: Home / Self Care | Payer: 59 | Source: Ambulatory Visit | Attending: Gynecologic Oncology | Admitting: Gynecologic Oncology

## 2014-08-10 ENCOUNTER — Ambulatory Visit (HOSPITAL_COMMUNITY): Payer: 59

## 2014-08-10 DIAGNOSIS — R1909 Other intra-abdominal and pelvic swelling, mass and lump: Secondary | ICD-10-CM | POA: Diagnosis present

## 2014-08-10 DIAGNOSIS — Z90710 Acquired absence of both cervix and uterus: Secondary | ICD-10-CM | POA: Insufficient documentation

## 2014-08-10 DIAGNOSIS — N949 Unspecified condition associated with female genital organs and menstrual cycle: Secondary | ICD-10-CM | POA: Insufficient documentation

## 2014-08-10 DIAGNOSIS — N9489 Other specified conditions associated with female genital organs and menstrual cycle: Secondary | ICD-10-CM

## 2014-08-12 ENCOUNTER — Encounter: Payer: Self-pay | Admitting: Gynecologic Oncology

## 2014-08-12 ENCOUNTER — Ambulatory Visit: Payer: 59 | Attending: Gynecologic Oncology | Admitting: Gynecologic Oncology

## 2014-08-12 VITALS — BP 150/90 | HR 79 | Temp 98.4°F | Resp 20 | Ht 63.0 in | Wt 260.1 lb

## 2014-08-12 DIAGNOSIS — N83201 Unspecified ovarian cyst, right side: Secondary | ICD-10-CM

## 2014-08-12 DIAGNOSIS — N644 Mastodynia: Secondary | ICD-10-CM | POA: Insufficient documentation

## 2014-08-12 DIAGNOSIS — Z803 Family history of malignant neoplasm of breast: Secondary | ICD-10-CM | POA: Diagnosis not present

## 2014-08-12 DIAGNOSIS — N8320 Unspecified ovarian cysts: Secondary | ICD-10-CM | POA: Diagnosis not present

## 2014-08-12 NOTE — Progress Notes (Signed)
Followup Note: Gyn-Onc   Tina Eaton 34 y.o. female  Chief Complaint  Patient presents with  . Adnexal Mass    Assessment : Pelvic cyst, likely benign, likely endometrioma. I reviewed the patient's imaging films including her Ultrasound from April 27th, 2016. The 8cm right ovarian cyst is stable in size and characteristics. I see no evidence of malignancy. She continues to be asymptomatic. The patient is reluctant to undergo surgery.   Right breast pain and strong family history of breast cancer.  Plan: I believe it is reasonable to discontinue surveillance imaging for this ovarian endometrioma. I discussed that indication for surgery would be new emergence of symptoms. While she is asymptomatic I believe that surgical exploration carries with it greater risk in this morbidly obese patient than benefit. I discussed that I'm not able to definitively rule out malignancy until surgical removal however overall my impression is that these are likely benign phenomenon and the patient is more concerned about surgical risk and recovery then the symptoms these masses create or the risk of failed diagnosis of malignancy.    I'm recommending mammography to further evaluate her breast pain given her strong family history of breast cancer. I discussed that breast pain is less likely to be associated with malignancy more likely to be associated with benign conditions and hormonal changes.  She will followup with Dr Gaetano Net on an annual basis for well woman checks. Repeat imaging is recommended only for new symptoms or re-emergence of pelvic pain.  HPI: 34 year old who was seen in consultation request of Dr. Gaetano Net regarding the management of a newly diagnosed pelvic masses. The patient presented with abdominal pain and imaging including ultrasound and MRI been performed. The MRI reveals a ovoid cystic lesion in the left adnexa measuring 8.5 x 5 x 4.3 cm. There are no septations or nodularity. In  addition there is a complex cystic lesion centered in the right perirectal presacral space posterior to the vaginal cuff measuring 7.2 x 8.1 x 7.8 cm. It displaces the rectum to the left there were no solid areas or nodules although there are's thick septations to this mass. Aside from pain the patient denies any other symptoms.  co-she was worked reluctant about undergoing surgery she canceled her January surgical date answered requested a follow-up ultrasound scan which was performed on 05/09/2014. It showed a left ovary measuring 3.7 x 2.6 x 3.1 cm with an associated left ovarian cyst measuring 7.6 x 3.9 x 3.1 cm. It had not not changed significantly to the prior study. It did not appear complex. There was no evidence of free fluid or solid adnexal masses. The right perirectal cyst identified previously was not visualized. The right ovary was normal in appearance measuring 3 x 2.5 x 1.8 cm  Repeat US on Apri 27th, 2016 showed a normal right ovary measuring 3.6x2.1x2.7cm with an associated complex cyst measuring 8.8x6x7.5cm which is complex and septated. It appears consistent with an endometrioma. The left ovary is normal measuring 3.2 x 2.5 x 3.4 cm.  Patient has a past history of undergoing a laparoscopically assisted vaginal hysterectomy in June 2012 for uterine fibroids. The uterus weighed 235 g. Patient is also had 2 prior cesarean sections.  She denies any significant past medical history.  Review of Systems:10 point review of systems is negative except as noted in interval history and recent development of right breast pain (intermittent, sharp shooting). She has a strong family history for breast cancer. Her last mammogram (screening) was 12 months  ago.  Vitals: Blood pressure 150/90, pulse 79, temperature 98.4 F (36.9 C), temperature source Oral, resp. rate 20, height 5\' 3"  (1.6 m), weight 260 lb 1.6 oz (117.981 kg), last menstrual period 08/14/2010.  Physical Exam: General : The patient  is a healthy woman in no acute distress.  HEENT: normocephalic, extraoccular movements normal; neck is supple without thyromegally  Lynphnodes: deferred Abdomen: deferred Pelvic:  deferred Lower extremities: No edema or varicosities. Normal range of motion      Allergies  Allergen Reactions  . Latex Itching    Itching and redness to affected site    Past Medical History  Diagnosis Date  . Anemia   . Mild persistent asthma 01/24/2012  . Post-nasal drip 06/11/2012    Past Surgical History  Procedure Laterality Date  . Abdominal hysterectomy  04/15/2010  . Cesarean section  2004, 2011    Current Outpatient Prescriptions  Medication Sig Dispense Refill  . Biotin 1 MG CAPS Take 1 each by mouth.    . Multiple Vitamin (MULTIVITAMIN) tablet Take 1 tablet by mouth daily.     No current facility-administered medications for this visit.    History   Social History  . Marital Status: Single    Spouse Name: N/A  . Number of Children: N/A  . Years of Education: N/A   Occupational History  . Not on file.   Social History Main Topics  . Smoking status: Never Smoker   . Smokeless tobacco: Never Used  . Alcohol Use: No  . Drug Use: No  . Sexual Activity: Yes    Birth Control/ Protection: Surgical   Other Topics Concern  . Not on file   Social History Narrative    Family History  Problem Relation Age of Onset  . Cancer Other   . Asthma Neg Hx   . Diabetes Neg Hx   . Early death Neg Hx   . Heart disease Neg Hx   . Hyperlipidemia Neg Hx   . Hypertension Neg Hx   . Kidney disease Neg Hx   . Stroke Neg Hx       Donaciano Eva, MD 08/12/2014, 10:00 AM

## 2014-08-12 NOTE — Patient Instructions (Signed)
You will receive a phone from our office or Dr. Sherlynn Stalls office with your upcoming mammogram.  Plan to follow up with Dr. Gaetano Net for future well women care or Dr. Denman George as needed.  Please call for any questions or concerns.

## 2014-08-30 ENCOUNTER — Other Ambulatory Visit: Payer: Self-pay | Admitting: Gynecologic Oncology

## 2014-08-30 DIAGNOSIS — N644 Mastodynia: Secondary | ICD-10-CM

## 2014-09-06 ENCOUNTER — Ambulatory Visit
Admission: RE | Admit: 2014-09-06 | Discharge: 2014-09-06 | Disposition: A | Discharge status: Home / Self Care | Payer: 59 | Source: Ambulatory Visit | Attending: Gynecologic Oncology | Admitting: Gynecologic Oncology

## 2014-09-06 DIAGNOSIS — N644 Mastodynia: Secondary | ICD-10-CM

## 2015-02-06 ENCOUNTER — Other Ambulatory Visit: Payer: Self-pay | Admitting: Gynecologic Oncology

## 2015-02-06 DIAGNOSIS — N6310 Unspecified lump in the right breast, unspecified quadrant: Secondary | ICD-10-CM

## 2015-03-13 ENCOUNTER — Ambulatory Visit
Admission: RE | Admit: 2015-03-13 | Discharge: 2015-03-13 | Disposition: A | Discharge status: Home / Self Care | Payer: 59 | Source: Ambulatory Visit | Attending: Gynecologic Oncology | Admitting: Gynecologic Oncology

## 2015-03-13 DIAGNOSIS — N6310 Unspecified lump in the right breast, unspecified quadrant: Secondary | ICD-10-CM

## 2015-04-25 DIAGNOSIS — R1903 Right lower quadrant abdominal swelling, mass and lump: Secondary | ICD-10-CM | POA: Diagnosis not present

## 2015-04-25 DIAGNOSIS — R1904 Left lower quadrant abdominal swelling, mass and lump: Secondary | ICD-10-CM | POA: Diagnosis not present

## 2015-10-19 DIAGNOSIS — L738 Other specified follicular disorders: Secondary | ICD-10-CM | POA: Diagnosis not present

## 2015-10-19 DIAGNOSIS — L659 Nonscarring hair loss, unspecified: Secondary | ICD-10-CM | POA: Diagnosis not present

## 2015-11-18 DIAGNOSIS — J019 Acute sinusitis, unspecified: Secondary | ICD-10-CM | POA: Diagnosis not present

## 2016-01-01 DIAGNOSIS — J4530 Mild persistent asthma, uncomplicated: Secondary | ICD-10-CM | POA: Diagnosis not present

## 2016-01-03 IMAGING — US US PELVIS COMPLETE
1 series · 13 of 25 positions shown · non-contrast
Comparison: None

CLINICAL DATA: Bilateral adnexal pain for several weeks, primarily
on the left

EXAM:
TRANSABDOMINAL AND TRANSVAGINAL ULTRASOUND OF PELVIS
TECHNIQUE: Both transabdominal and transvaginal ultrasound examinations of the
pelvis were performed. Transabdominal technique was performed for
global imaging of the pelvis including uterus, ovaries, adnexal
regions, and pelvic cul-de-sac. It was necessary to proceed with
endovaginal exam following the transabdominal exam to visualize the
ovaries and adnexal regions.

[Series 1: us pelvis complete · 0.28mm/px · 13 of 74 slices shown]
[im 1/74]
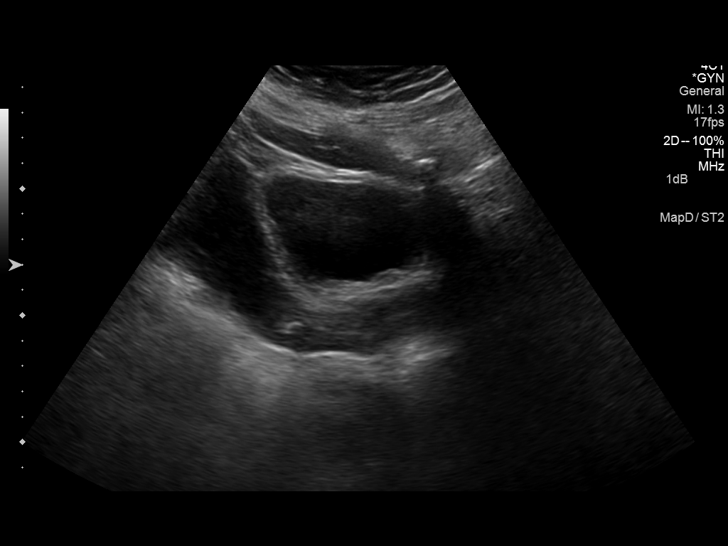
[im 7/74]
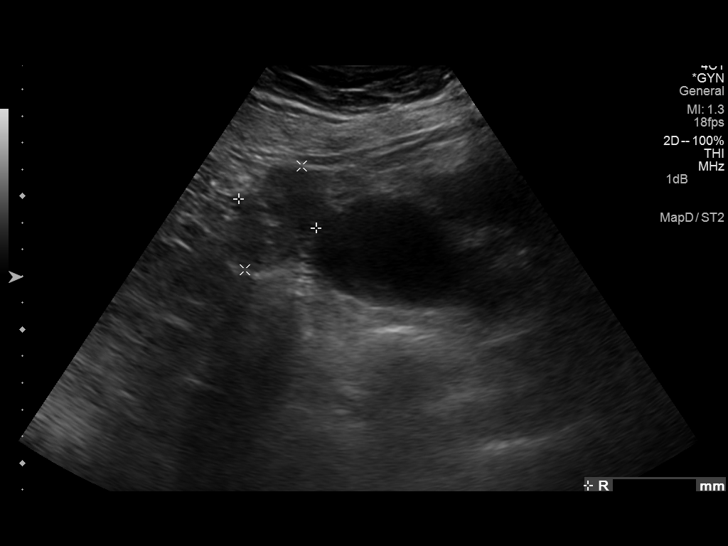
[im 13/74]
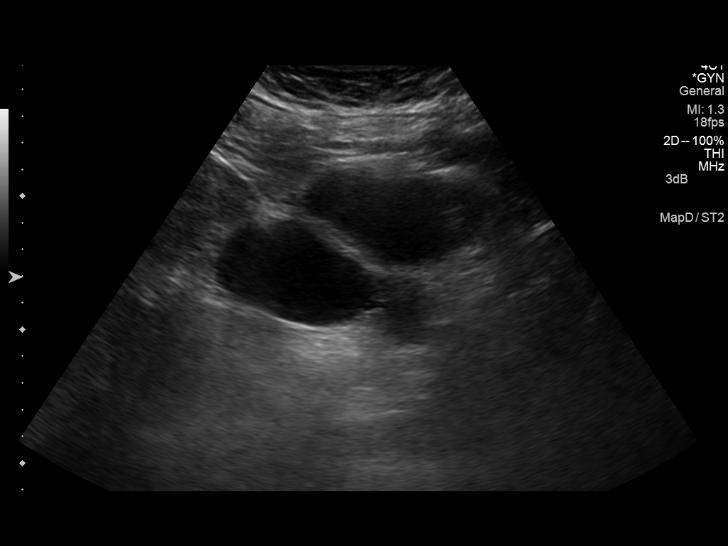
[im 19/74]
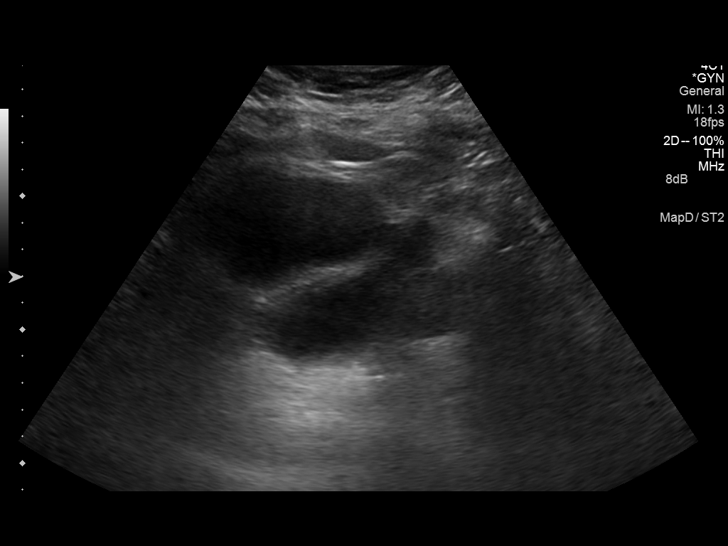
[im 25/74]
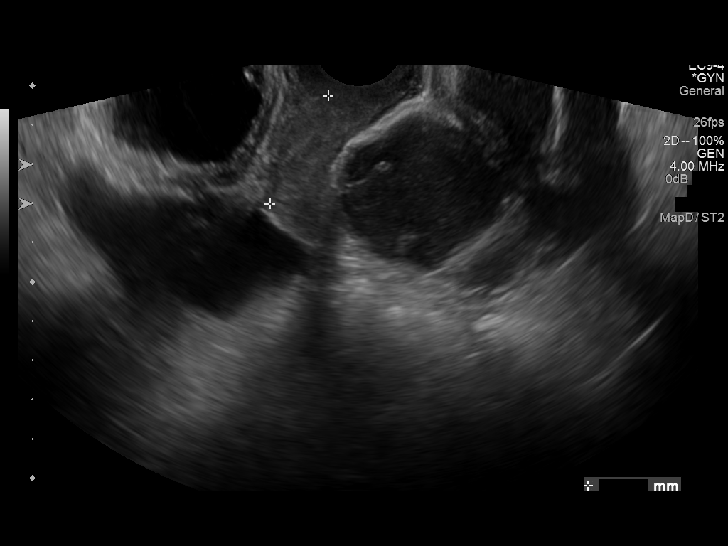
[im 31/74]
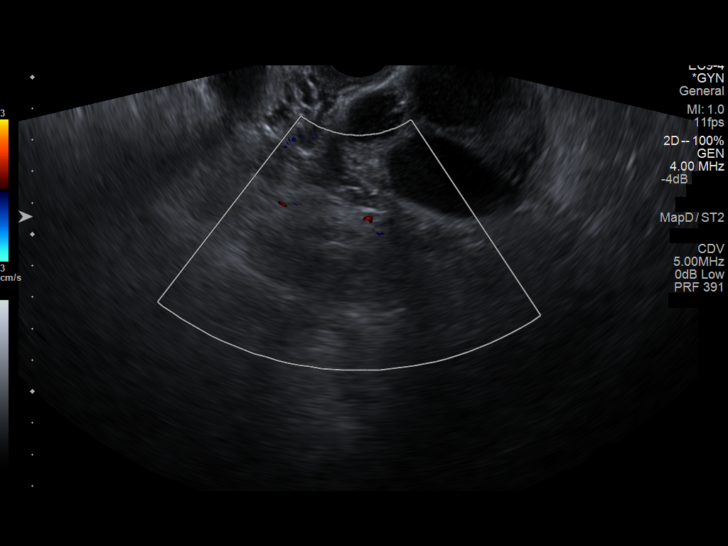
[im 37/74]
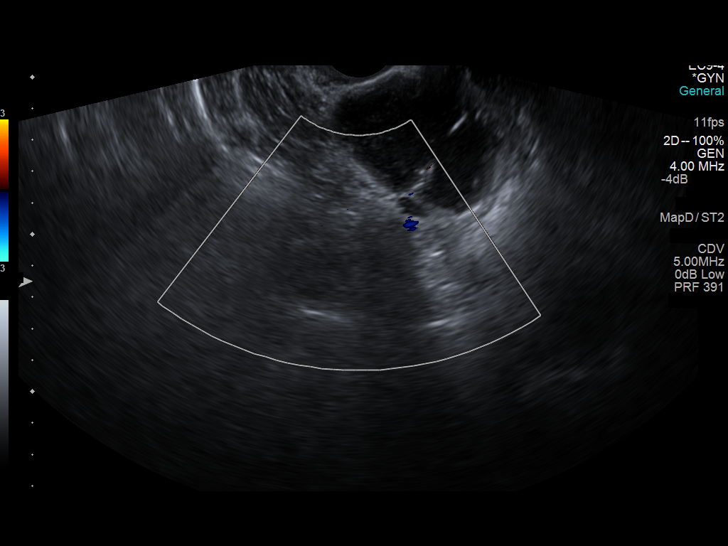
[im 43/74]
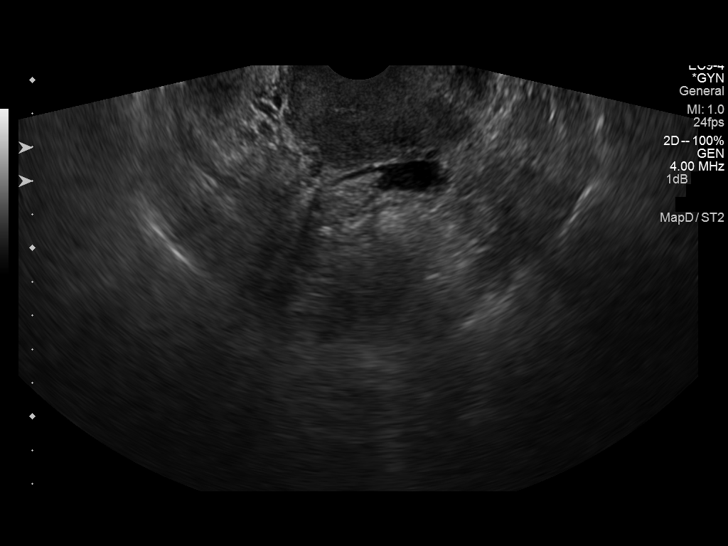
[im 49/74]
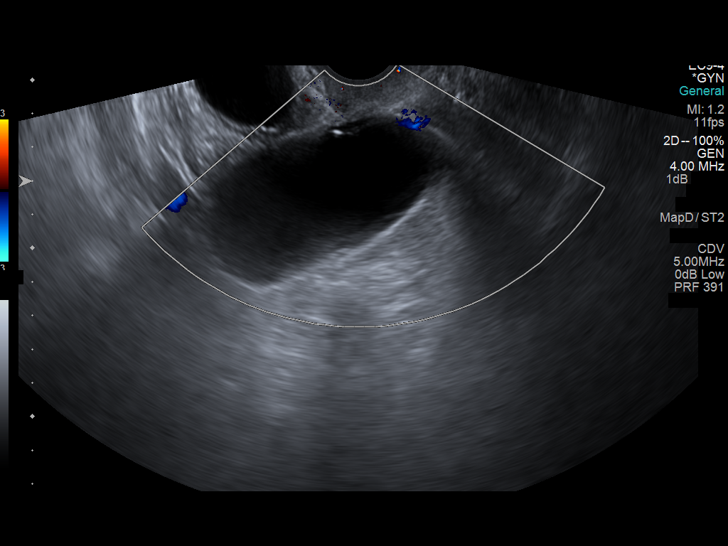
[im 55/74]
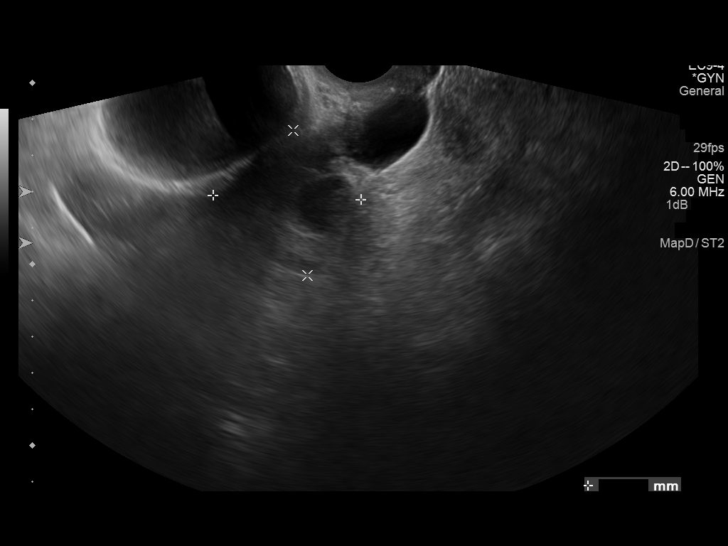
[im 61/74]
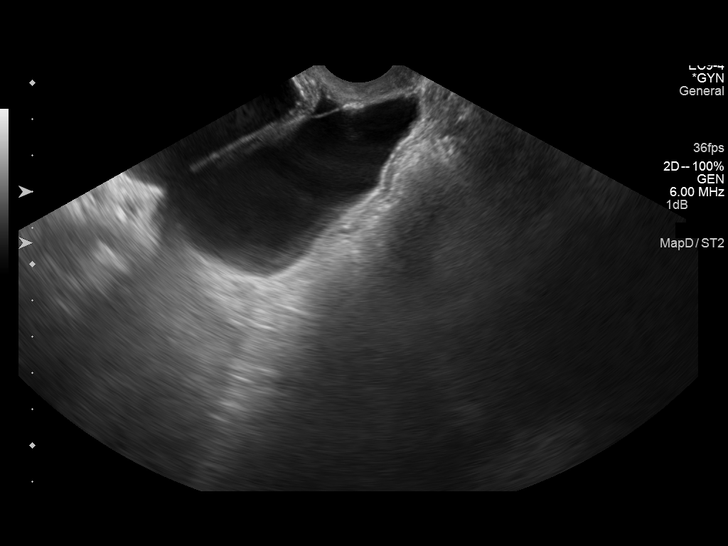
[im 67/74]
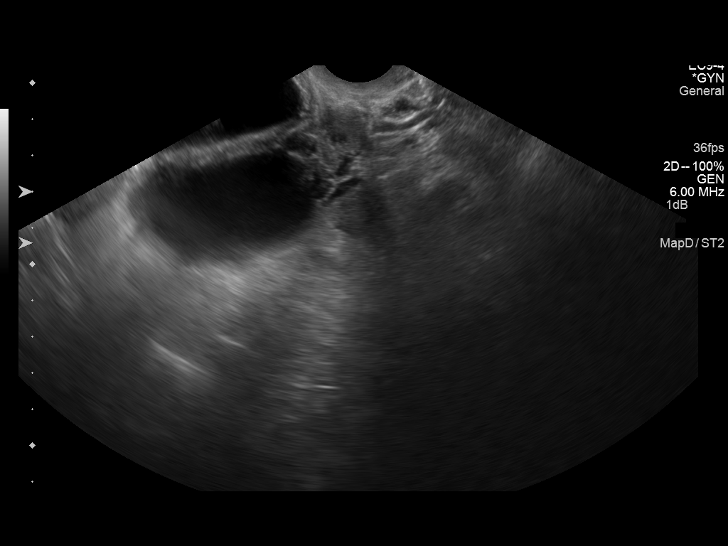
[im 74/74]
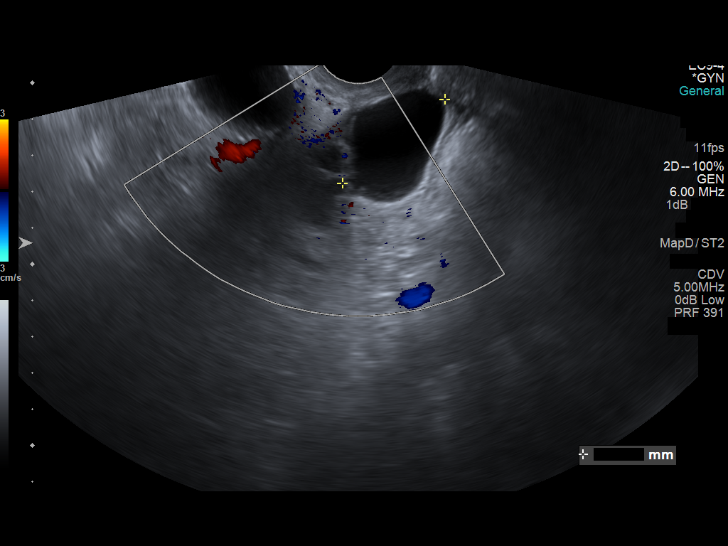

[13 of 25 positions shown; findings below may reference images not displayed]

FINDINGS: Uterus

Measurements: The uterus has been surgically resected..

Endometrium

Thickness: Not applicable.  The cervix measures 3.1 cm

Right ovary

Measurements: The right ovary measures 5.2 x 3.6 x 4.6 cm.. There is
is cyst probably from the right ovary extending toward the midline
of 8.3 x 5.2 x 4.1 cm.

Left ovary

Measurements: 4.1 x 4.0 x 5.5 cm.. Several cysts are present, the
largest of 7.9 x 3.5 x 3.7 cm.

Other findings

This study is somewhat limited due to patient's discomfort.
IMPRESSION: 1. Bilateral ovarian/adnexal cysts. No definite complicating
features. If further assessment is warranted MRI of the pelvis would
be recommended.
2. Prior hysterectomy.

## 2016-01-31 DIAGNOSIS — Z6841 Body Mass Index (BMI) 40.0 and over, adult: Secondary | ICD-10-CM | POA: Diagnosis not present

## 2016-01-31 DIAGNOSIS — R7989 Other specified abnormal findings of blood chemistry: Secondary | ICD-10-CM | POA: Diagnosis not present

## 2016-01-31 DIAGNOSIS — J4530 Mild persistent asthma, uncomplicated: Secondary | ICD-10-CM | POA: Diagnosis not present

## 2016-01-31 MED FILL — FLOVENT HFA 110 MCG INHALER: 110 | 30 days supply | Qty: 12 | Fill #0

## 2016-02-07 ENCOUNTER — Other Ambulatory Visit: Payer: Self-pay | Admitting: Obstetrics and Gynecology

## 2016-02-07 DIAGNOSIS — N6310 Unspecified lump in the right breast, unspecified quadrant: Secondary | ICD-10-CM

## 2016-02-16 ENCOUNTER — Encounter: Payer: Self-pay | Admitting: Internal Medicine

## 2016-02-16 ENCOUNTER — Ambulatory Visit (INDEPENDENT_AMBULATORY_CARE_PROVIDER_SITE_OTHER): Payer: 59 | Admitting: Internal Medicine

## 2016-02-16 VITALS — BP 124/82 | HR 89 | Ht 65.5 in | Wt 232.0 lb

## 2016-02-16 DIAGNOSIS — E0580 Other thyrotoxicosis without thyrotoxic crisis or storm: Secondary | ICD-10-CM | POA: Diagnosis not present

## 2016-02-16 DIAGNOSIS — E0590 Thyrotoxicosis, unspecified without thyrotoxic crisis or storm: Secondary | ICD-10-CM

## 2016-02-16 LAB — T3, FREE: T3, Free: 7.2 pg/mL — ABNORMAL HIGH (ref 2.3–4.2)

## 2016-02-16 LAB — TSH: TSH: 0.02 u[IU]/mL — ABNORMAL LOW (ref 0.35–4.50)

## 2016-02-16 LAB — T4, FREE: Free T4: 1.8 ng/dL — ABNORMAL HIGH (ref 0.60–1.60)

## 2016-02-16 NOTE — Patient Instructions (Addendum)
Please stop at the lab.  If we need to start methimazole: Please stop the Methimazole (Tapazole) and call us or your primary care doctor if you develop: - sore throat - fever - yellow skin - dark urine - light colored stools As we will then need to check your blood counts and liver tests.  Please come back for a follow-up appointment in 3 months.   Hyperthyroidism Hyperthyroidism is when the thyroid is too active (overactive). Your thyroid is a large gland that is located in your neck. The thyroid helps to control how your body uses food (metabolism). When your thyroid is overactive, it produces too much of a hormone called thyroxine.  CAUSES Causes of hyperthyroidism may include:  Graves disease. This is when your immune system attacks the thyroid gland. This is the most common cause.  Inflammation of the thyroid gland.  Tumor in the thyroid gland or somewhere else.  Excessive use of thyroid medicines, including:  Prescription thyroid supplement.  Herbal supplements that mimic thyroid hormones.  Solid or fluid-filled lumps within your thyroid gland (thyroid nodules).  Excessive ingestion of iodine. RISK FACTORS  Being female.  Having a family history of thyroid conditions. SIGNS AND SYMPTOMS Signs and symptoms of hyperthyroidism may include:  Nervousness.  Inability to tolerate heat.  Unexplained weight loss.  Diarrhea.  Change in the texture of hair or skin.  Heart skipping beats or making extra beats.  Rapid heart rate.  Loss of menstruation.  Shaky hands.  Fatigue.  Restlessness.  Increased appetite.  Sleep problems.  Enlarged thyroid gland or nodules. DIAGNOSIS  Diagnosis of hyperthyroidism may include:  Medical history and physical exam.  Blood tests.  Ultrasound tests. TREATMENT Treatment may include:  Medicines to control your thyroid.  Surgery to remove your thyroid.  Radiation therapy. HOME CARE INSTRUCTIONS   Take  medicines only as directed by your health care provider.  Do not use any tobacco products, including cigarettes, chewing tobacco, or electronic cigarettes. If you need help quitting, ask your health care provider.  Do not exercise or do physical activity until your health care provider approves.  Keep all follow-up appointments as directed by your health care provider. This is important. SEEK MEDICAL CARE IF:  Your symptoms do not get better with treatment.  You have fever.  You are taking thyroid replacement medicine and you:  Have depression.  Feel mentally and physically slow.  Have weight gain. SEEK IMMEDIATE MEDICAL CARE IF:   You have decreased alertness or a change in your awareness.  You have abdominal pain.  You feel dizzy.  You have a rapid heartbeat.  You have an irregular heartbeat.   This information is not intended to replace advice given to you by your health care provider. Make sure you discuss any questions you have with your health care provider.   Document Released: 04/01/2005 Document Revised: 04/22/2014 Document Reviewed: 08/17/2013 Elsevier Interactive Patient Education [employer].

## 2016-02-16 NOTE — Progress Notes (Signed)
Patient ID: Tina Eaton, female   DOB: [birth year], 35 y.o.   MRN: [id number]    HPI  Tina Eaton is a 35 y.o.-year-old female, referred by her PCP, Dr. Kathyrn Lass, for evaluation and treatment of thyrotoxicosis.  She started to have pbs breathing - worse this summer. She saw her PCP who checked several labs including a TSH. This returned abnormal. The other thyroid hormones and the thyroid antibodies were added to the blood in the lab, and they were also abnormal, indicating thyrotoxicosis.  I reviewed pt's thyroid tests: 01/31/2016: TSH <0.01, free T4 2.09 (0.61-1.12), total T3 395 (71-180), TSI 310 (0-139%) Lab Results  Component Value Date   TSH 1.02 01/24/2012    Pt denies feeling nodules in neck, hoarseness, dysphagia/odynophagia, SOB with lying down; she c/o: - + fatigue - + excessive sweating/heat intolerance, alternating with cold intolerance -but not changed - no tremors - no anxiety - + palpitations - no hyperdefecation - + weight loss  -30 lbs in last 1.5 years - + hair loss - localized  Pt does not have a FH of thyroid ds. No FH of thyroid cancer. No h/o radiation tx to head or neck.  No seaweed or kelp, no recent contrast studies. No steroid use. No herbal supplements. + Biotin use.  ROS: Constitutional: + See history of present illness Eyes: + blurry vision - while driving at night, no xerophthalmia ENT: no sore throat, + see history of present illness Cardiovascular: no CP/+ SOB/no palpitations/leg swelling Respiratory: + cough/+ SOB Gastrointestinal: no N/V/D/C Musculoskeletal: no muscle/joint aches Skin: no rashes, + hair loss Neurological: no tremors/numbness/tingling/dizziness Psychiatric: no depression/anxiety + + Low libido  Past Medical History:  Diagnosis Date  . Anemia   . Mild persistent asthma 01/24/2012  . Post-nasal drip 06/11/2012   Past Surgical History:  Procedure Laterality Date  . ABDOMINAL HYSTERECTOMY  04/15/2010  .  CESAREAN SECTION  2004, 2011   Social History   Social History  . Marital status: Single    Spouse name: N/A  . Number of children: 2   Occupational History  . Radiology tech extender, receptionist    Social History Main Topics  . Smoking status: Never Smoker  . Smokeless tobacco: Never Used  . Alcohol use No  . Drug use: No  . Sexual activity: Yes    Birth control/ protection: Surgical - TAH   Current Outpatient Prescriptions on File Prior to Visit  Medication Sig Dispense Refill  . Biotin 1 MG CAPS Take 1 each by mouth.    . Multiple Vitamin (MULTIVITAMIN) tablet Take 1 tablet by mouth daily.     No current facility-administered medications on file prior to visit.    Allergies  Allergen Reactions  . Amoxicillin     Other reaction(s): sick on stomach, vomiting  . Latex Itching    Itching and redness to affected site   Family History  Problem Relation Age of Onset  . Cancer Other   . Asthma Neg Hx   . Diabetes Neg Hx   . Early death Neg Hx   . Heart disease Neg Hx   . Hyperlipidemia Neg Hx   . Hypertension Neg Hx   . Kidney disease Neg Hx   . Stroke Neg Hx    She has diabetes, HTN, HL in aunts and uncles, heart problems in aunts and cancer in grandmother aunts and uncles  PE: BP 124/82 (BP Location: Left Arm, Patient Position: Sitting)   Pulse 89  Ht 5' 5.5" (1.664 m)   Wt 232 lb (105.2 kg)   LMP 08/14/2010   SpO2 98%   BMI 38.02 kg/m  Wt Readings from Last 3 Encounters:  02/16/16 232 lb (105.2 kg)  08/12/14 260 lb 1.6 oz (118 kg)  05/13/14 252 lb 12.8 oz (114.7 kg)   Constitutional: overweight, in NAD Eyes: PERRLA, EOMI, no exophthalmos, no lid lag, no stare ENT: moist mucous membranes, + symmetric, smooth thyromegaly, no thyroid bruits, no cervical lymphadenopathy Cardiovascular: RRR, No MRG Respiratory: CTA B Gastrointestinal: abdomen soft, NT, ND, BS+ Musculoskeletal: no deformities, strength intact in all 4 Skin: moist, warm, no  rashes Neurological: no tremor with outstretched hands, DTR normal in all 4  ASSESSMENT: 1. Thyrotoxicosis  PLAN:  1. Patient with a recently found low TSH along with elevated free T4 and free T3 and Graves antibodies, with + weight loss, but no significant heat intolerance, hyperdefecation, palpitations, anxiety.  - she does not appear to have exogenous causes for the low TSH.  - We discussed that possible causes of thyrotoxicosis are:  Graves ds (TSIs were mildly elevated; no FH of autoimmune ds, though) Thyroiditis  toxic multinodular goiter/ toxic adenoma (I cannot feel nodules at palpation of her thyroid, but her thyroid does appear enlarged) - I suggested that we check the TSH, fT3 and fT4  - If the tests remain abnormal, we may need an uptake and scan to differentiate between the 3 above possible etiologies  - we discussed about possible modalities of treatment for the above conditions, to include methimazole use, radioactive iodine ablation or (last resort) surgery. - we might need to do thyroid ultrasound depending on the results of the uptake and scan (if a cold nodule is present) - I do not feel that we need to add beta blockers at this time, since she is not tachycardic, anxious, or tremulous - I advised her to join my chart to communicate easier - RTC in 3 months, but likely sooner for repeat labs  Component     Latest Ref Rng & Units 02/16/2016  TSH     0.35 - 4.50 uIU/mL 0.02 (L)  Triiodothyronine,Free,Serum     2.3 - 4.2 pg/mL 7.2 (H)  T4,Free(Direct)     0.60 - 1.60 ng/dL 1.80 (H)   Thyroid tests are slightly better, however they are all still abnormal. Based on the history and the elevated Graves antibodies, I believe that she has Graves' disease so we'll try to forego the uptake and scan. I would suggest to start methimazole 5 mg twice a day and recheck her TFTs in 1.5 months.  Tina Kingdom, MD PhD [facility]

## 2016-02-19 ENCOUNTER — Encounter: Payer: Self-pay | Admitting: Internal Medicine

## 2016-02-19 DIAGNOSIS — E0500 Thyrotoxicosis with diffuse goiter without thyrotoxic crisis or storm: Secondary | ICD-10-CM | POA: Insufficient documentation

## 2016-02-19 MED ORDER — METHIMAZOLE 5 MG PO TABS: 5.0000 mg | ORAL_TABLET | Freq: Two times a day (BID) | ORAL | 1 refills | Status: DC

## 2016-03-13 ENCOUNTER — Ambulatory Visit
Admission: RE | Admit: 2016-03-13 | Discharge: 2016-03-13 | Disposition: A | Discharge status: Home / Self Care | Payer: 59 | Source: Ambulatory Visit | Attending: Obstetrics and Gynecology | Admitting: Obstetrics and Gynecology

## 2016-03-13 DIAGNOSIS — N6310 Unspecified lump in the right breast, unspecified quadrant: Secondary | ICD-10-CM | POA: Diagnosis not present

## 2016-03-13 DIAGNOSIS — N6320 Unspecified lump in the left breast, unspecified quadrant: Secondary | ICD-10-CM | POA: Diagnosis not present

## 2016-03-15 ENCOUNTER — Other Ambulatory Visit: Payer: Self-pay

## 2016-03-15 ENCOUNTER — Encounter: Payer: Self-pay | Admitting: Internal Medicine

## 2016-03-15 MED ORDER — METHIMAZOLE 5 MG PO TABS: 5.0000 mg | ORAL_TABLET | Freq: Two times a day (BID) | ORAL | 1 refills | Status: DC

## 2016-03-15 MED FILL — methIMAzole 5 MG TABS: 5 | 45 days supply | Qty: 90 | Fill #0

## 2016-03-15 NOTE — Telephone Encounter (Signed)
methimazole (TAPAZOLE) 5 MG tablet  Send to  [place], [place] - [address] (Phone) [phone] (Fax)

## 2016-03-18 ENCOUNTER — Encounter: Payer: Self-pay | Admitting: Internal Medicine

## 2016-03-21 ENCOUNTER — Other Ambulatory Visit (INDEPENDENT_AMBULATORY_CARE_PROVIDER_SITE_OTHER): Payer: 59

## 2016-03-21 ENCOUNTER — Ambulatory Visit: Payer: 59 | Admitting: Internal Medicine

## 2016-03-21 DIAGNOSIS — E0590 Thyrotoxicosis, unspecified without thyrotoxic crisis or storm: Secondary | ICD-10-CM | POA: Diagnosis not present

## 2016-03-21 LAB — T3, FREE: T3, Free: 4.2 pg/mL (ref 2.3–4.2)

## 2016-03-21 LAB — T4, FREE: Free T4: 1.11 ng/dL (ref 0.60–1.60)

## 2016-03-21 LAB — TSH: TSH: 0.02 u[IU]/mL — ABNORMAL LOW (ref 0.35–4.50)

## 2016-03-22 DIAGNOSIS — Z6841 Body Mass Index (BMI) 40.0 and over, adult: Secondary | ICD-10-CM | POA: Diagnosis not present

## 2016-03-22 DIAGNOSIS — Z01419 Encounter for gynecological examination (general) (routine) without abnormal findings: Secondary | ICD-10-CM | POA: Diagnosis not present

## 2016-04-01 DIAGNOSIS — H5213 Myopia, bilateral: Secondary | ICD-10-CM | POA: Diagnosis not present

## 2016-04-18 DIAGNOSIS — R1909 Other intra-abdominal and pelvic swelling, mass and lump: Secondary | ICD-10-CM | POA: Diagnosis not present

## 2016-04-29 MED FILL — methIMAzole 5 MG TABS: 5 | 45 days supply | Qty: 90 | Fill #1

## 2016-05-03 DIAGNOSIS — M79669 Pain in unspecified lower leg: Secondary | ICD-10-CM | POA: Diagnosis not present

## 2016-05-03 DIAGNOSIS — J329 Chronic sinusitis, unspecified: Secondary | ICD-10-CM | POA: Diagnosis not present

## 2016-05-17 ENCOUNTER — Encounter: Payer: Self-pay | Admitting: Internal Medicine

## 2016-05-17 ENCOUNTER — Ambulatory Visit (INDEPENDENT_AMBULATORY_CARE_PROVIDER_SITE_OTHER): Payer: 59 | Admitting: Internal Medicine

## 2016-05-17 VITALS — BP 132/88 | HR 85 | Wt 248.0 lb

## 2016-05-17 DIAGNOSIS — E0500 Thyrotoxicosis with diffuse goiter without thyrotoxic crisis or storm: Secondary | ICD-10-CM

## 2016-05-17 NOTE — Patient Instructions (Addendum)
Please stop Biotin and come back for labs in 5 days, then can restart Biotin.  Please continue Methimazole 5 mg 2x a day.  Please come back for a follow-up appointment in 4 months.

## 2016-05-17 NOTE — Progress Notes (Signed)
Patient ID: Tina Eaton, female   DOB: Sep 11, 1980, 36 y.o.   MRN: [id number]    HPI  Tina Eaton is a 36 y.o.-year-old female, initially referred by her PCP, Dr. Kathyrn Lass, now returning for management of Graves ds. Last visit 3 mo ago.  Reviewed hx: She started to have pbs breathing - worse summer 2017. She saw her PCP who checked several labs including a TSH. This returned abnormal. The other thyroid hormones and the thyroid antibodies were added to the blood in the lab, and they were also abnormal, indicating thyrotoxicosis.  I reviewed pt's thyroid tests: Lab Results  Component Value Date   TSH 0.02 (L) 03/21/2016   TSH 0.02 (L) 02/16/2016   TSH 1.02 01/24/2012   FREET4 1.11 03/21/2016   FREET4 1.80 (H) 02/16/2016  01/31/2016: TSH <0.01, free T4 2.09 (0.61-1.12), total T3 395 (71-180), TSI 310 (0-139%)   She most likely has Graves' disease based on time course and elevated TSI  >> we did not do an uptake and scan.   We started methimazole 5 mg twice a day at last visit >> TFTs improved 2 mo ago. She gained 16 lbs. She feels MUCH better! - no fatigue - resolved excessive sweating/heat intolerance - no tremors - no anxiety - resolved palpitations - no hyperdefecation - Prev. weight loss  -30 lbs in last 1.5 years >> now weight gain since last visit - + hair loss >> started Biotin + other vitamins >> last dose yesterday!!!  Pt denies feeling nodules in neck, hoarseness, dysphagia/odynophagia, SOB with lying down.  Pt does not have a FH of thyroid ds. No FH of thyroid cancer. No h/o radiation tx to head or neck.  No seaweed or kelp, no recent contrast studies. No steroid use. No herbal supplements. + Biotin use.  ROS: Constitutional: + See history of present illness Eyes: no blurry vision, no xerophthalmia ENT: no sore throat, + see history of present illness Cardiovascular: no CP/SOB/no palpitations/leg swelling Respiratory: no cough/SOB Gastrointestinal: no  N/V/D/C Musculoskeletal: no muscle/joint aches Skin: no rashes, + hair loss Neurological: no tremors/numbness/tingling/dizziness  I reviewed pt's medications, allergies, PMH, social hx, family hx, and changes were documented in the history of present illness. Otherwise, unchanged from my initial visit note.  Past Medical History:  Diagnosis Date  . Anemia   . Mild persistent asthma 01/24/2012  . Post-nasal drip 06/11/2012   Past Surgical History:  Procedure Laterality Date  . ABDOMINAL HYSTERECTOMY  04/15/2010  . CESAREAN SECTION  2004, 2011   Social History   Social History  . Marital status: Single    Spouse name: N/A  . Number of children: 2   Occupational History  . Radiology tech extender, receptionist    Social History Main Topics  . Smoking status: Never Smoker  . Smokeless tobacco: Never Used  . Alcohol use No  . Drug use: No  . Sexual activity: Yes    Birth control/ protection: Surgical - TAH   Current Outpatient Prescriptions on File Prior to Visit  Medication Sig Dispense Refill  . albuterol (PROAIR HFA) 108 (90 Base) MCG/ACT inhaler 2 puffs as needed    . fluticasone (FLOVENT HFA) 110 MCG/ACT inhaler 1 puff    . methimazole (TAPAZOLE) 5 MG tablet Take 1 tablet (5 mg total) by mouth 2 (two) times daily. 90 tablet 1  . Multiple Vitamin (MULTIVITAMIN) tablet Take 1 tablet by mouth daily.    . Omega-3 Fatty Acids (FISH OIL PO) Take  by mouth.    . valACYclovir (VALTREX) 1000 MG tablet 2 tablets     No current facility-administered medications on file prior to visit.    Allergies  Allergen Reactions  . Amoxicillin     Other reaction(s): sick on stomach, vomiting  . Latex Itching    Itching and redness to affected site   Family History  Problem Relation Age of Onset  . Cancer Other   . Asthma Neg Hx   . Diabetes Neg Hx   . Early death Neg Hx   . Heart disease Neg Hx   . Hyperlipidemia Neg Hx   . Hypertension Neg Hx   . Kidney disease Neg Hx   .  Stroke Neg Hx    She has diabetes, HTN, HL in aunts and uncles, heart problems in aunts and cancer in grandmother aunts and uncles  PE: BP 132/88 (BP Location: Left Arm, Patient Position: Sitting)   Pulse 85   Wt 248 lb (112.5 kg)   LMP 08/14/2010   SpO2 98%   BMI 40.64 kg/m  Wt Readings from Last 3 Encounters:  05/17/16 248 lb (112.5 kg)  02/16/16 232 lb (105.2 kg)  08/12/14 260 lb 1.6 oz (118 kg)   Constitutional: overweight, in NAD Eyes: PERRLA, EOMI, no exophthalmos, no lid lag, no stare ENT: moist mucous membranes, + symmetric, smooth thyromegaly, no cervical lymphadenopathy Cardiovascular: RRR, No MRG Respiratory: CTA B Gastrointestinal: abdomen soft, NT, ND, BS+ Musculoskeletal: no deformities, strength intact in all 4 Skin: moist, warm, no rashes Neurological: no tremor with outstretched hands, DTR normal in all 4  ASSESSMENT: 1. Graves ds.  PLAN:  1. Patient with a recently dx'ed Graves ds initially with + weight loss, but no significant heat intolerance, hyperdefecation, palpitations, anxiety. Overall, she feels much better after starting MMI 5 mg bid at last visit. She has no SEs. - will check the TSH, fT3 and fT4, but will need to stop Biotin for 5 days first. She will return for the labs. - we again discussed about possible modalities of treatment for the above conditions, to include methimazole use, radioactive iodine ablation or (last resort) surgery. She is responding well to [place] >> will continue. - I do not feel that we need to add beta blockers at this time, since she is not tachycardic, anxious, or tremulous - RTC in 4 months, but sooner for repeat labs  Needs refills.  Lab on 03/21/2016  Component Date Value Ref Range Status  . T3, Free 03/21/2016 4.2  2.3 - 4.2 pg/mL Final  . Free T4 03/21/2016 1.11  0.60 - 1.60 ng/dL Final   Comment: Specimens from patients who are undergoing biotin therapy and /or ingesting biotin supplements may contain high levels  of biotin.  The higher biotin concentration in these specimens interferes with this Free T4 assay.  Specimens that contain high levels  of biotin may cause false high results for this Free T4 assay.  Please interpret results in light of the total clinical presentation of the patient.    Marland Kitchen TSH 03/21/2016 0.02* 0.35 - 4.50 uIU/mL Final   Msg sent: Written by Philemon Kingdom, MD on 05/22/2016 12:27 PM  Dear Ms. Sedita,  The thyroid tests have improved further. I would suggest to decrease the methimazole to 5 mg once a day and have you back for labs in 1.5 months.  Please call our main office number [phone]) to schedule a lab appointment.  Sincerely,  Philemon Kingdom MD    Salena Saner  Cruzita Lederer, MD PhD [facility]

## 2016-05-22 ENCOUNTER — Other Ambulatory Visit (INDEPENDENT_AMBULATORY_CARE_PROVIDER_SITE_OTHER): Payer: 59

## 2016-05-22 DIAGNOSIS — E0500 Thyrotoxicosis with diffuse goiter without thyrotoxic crisis or storm: Secondary | ICD-10-CM

## 2016-05-22 LAB — TSH: TSH: 0.22 u[IU]/mL — ABNORMAL LOW (ref 0.35–4.50)

## 2016-05-22 LAB — T4, FREE: Free T4: 0.59 ng/dL — ABNORMAL LOW (ref 0.60–1.60)

## 2016-05-22 LAB — T3, FREE: T3, Free: 3.9 pg/mL (ref 2.3–4.2)

## 2016-05-22 MED ORDER — METHIMAZOLE 5 MG PO TABS: 5.0000 mg | ORAL_TABLET | Freq: Every day | ORAL | 1 refills | Status: DC

## 2016-05-30 IMAGING — US US PELVIS COMPLETE
1 series · 13 of 25 positions shown · non-contrast
Comparison: Pelvic ultrasound dated 05/09/2014. MR pelvis dated
03/22/2014.

CLINICAL DATA: Follow-up adnexal mass

EXAM:
TRANSABDOMINAL AND TRANSVAGINAL ULTRASOUND OF PELVIS
TECHNIQUE: Both transabdominal and transvaginal ultrasound examinations of the
pelvis were performed. Transabdominal technique was performed for
global imaging of the pelvis including uterus, ovaries, adnexal
regions, and pelvic cul-de-sac. It was necessary to proceed with
endovaginal exam following the transabdominal exam to visualize the
right ovary.

[Series 1: us pelvis complete · 0.24mm/px · 74 acquisitions, 13 frames shown]
[im 1/74]
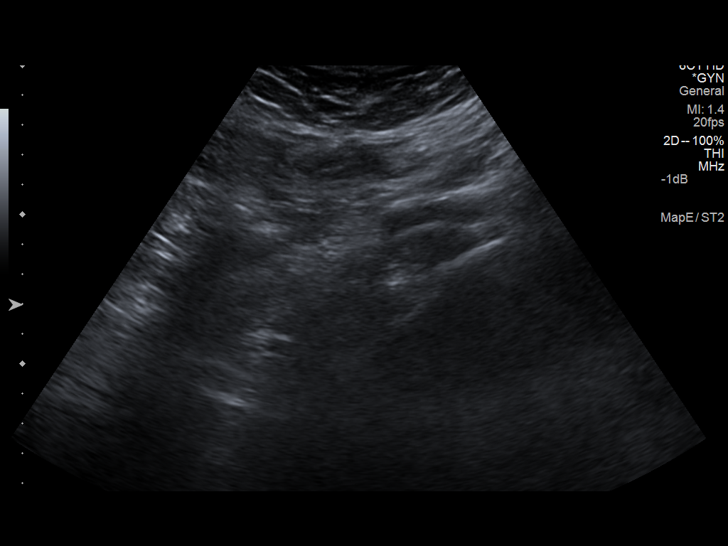
[im 7/74]
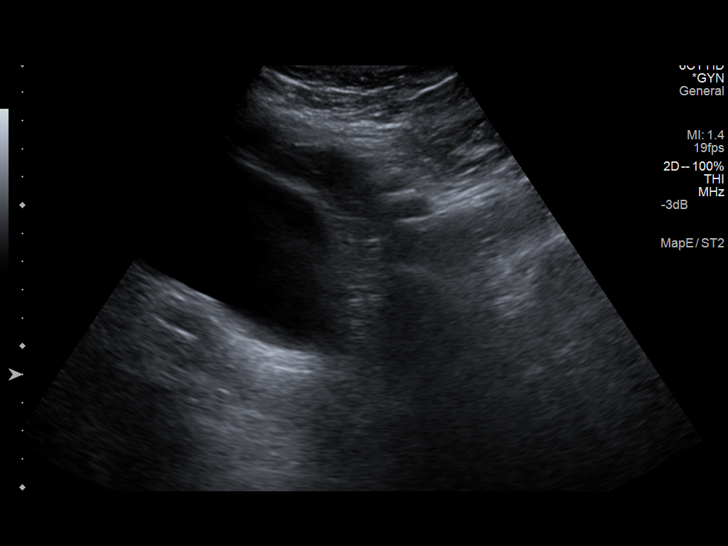
[im 13/74]
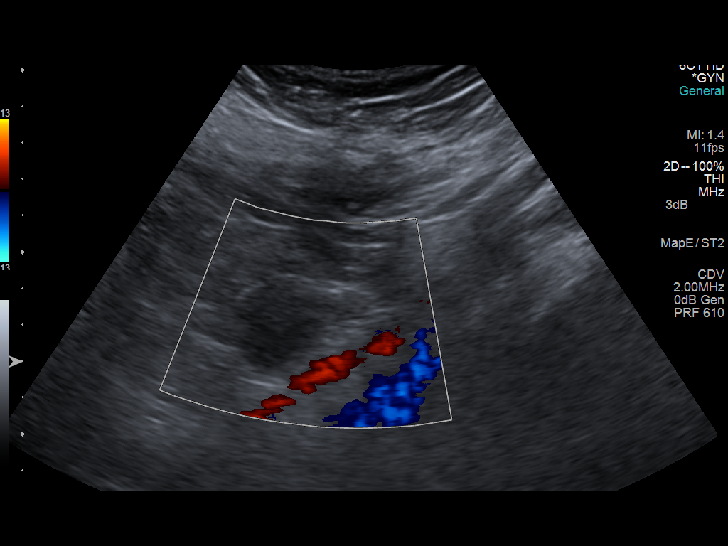
[im 19/74]
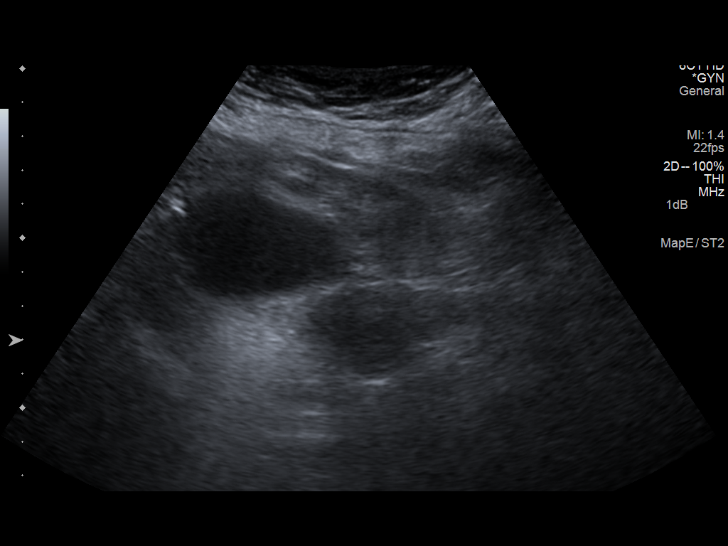
[im 25/74]
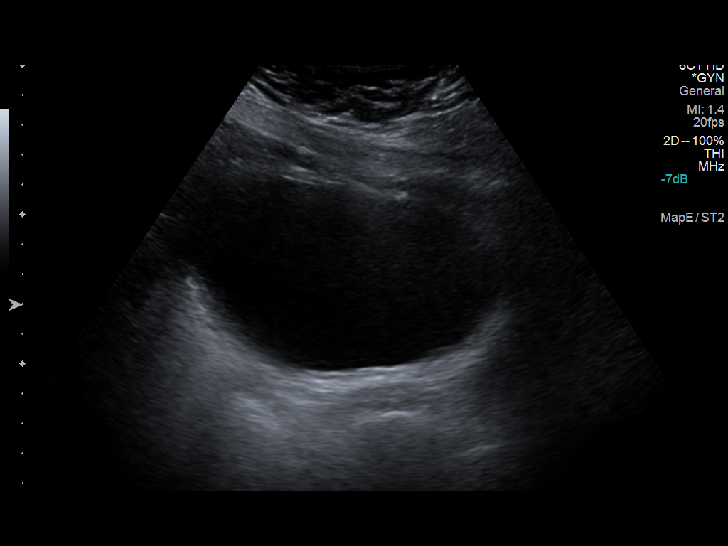
[im 31/74]
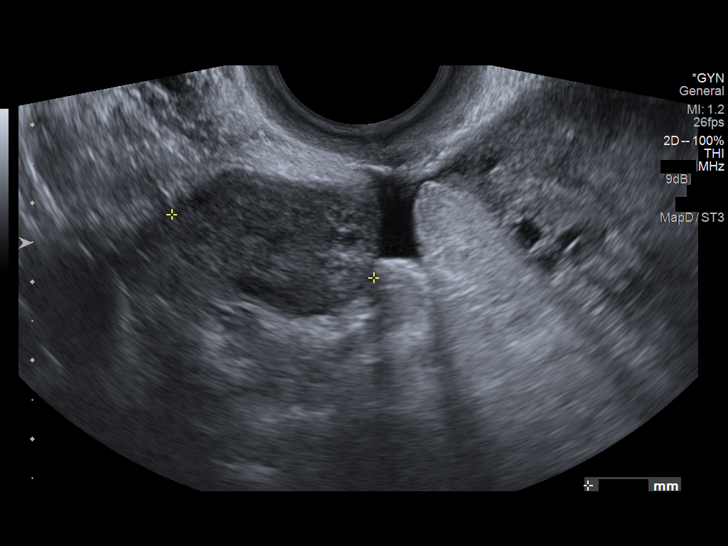
[im 37/74]
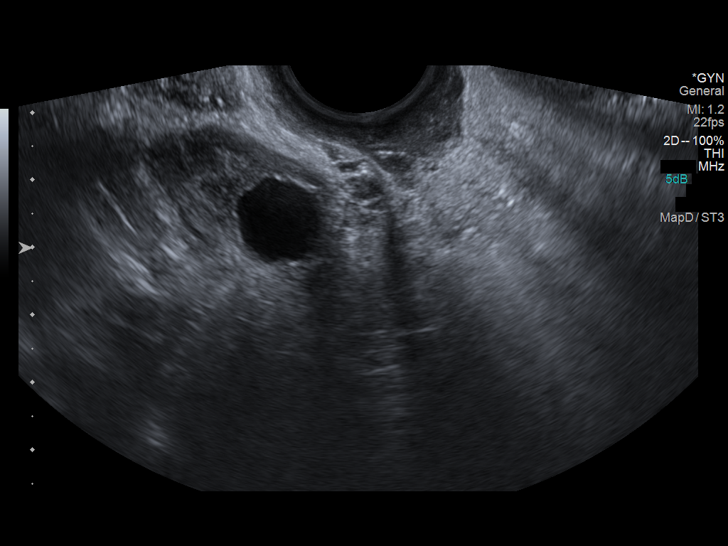
[im 43/74]
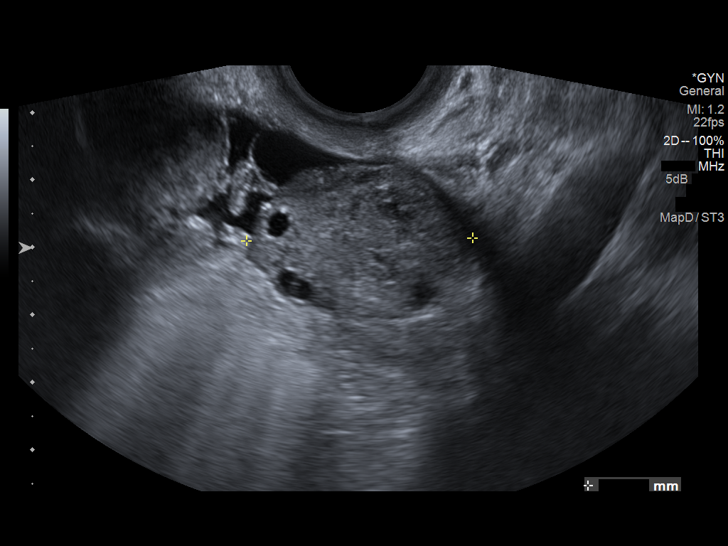
[im 49/74]
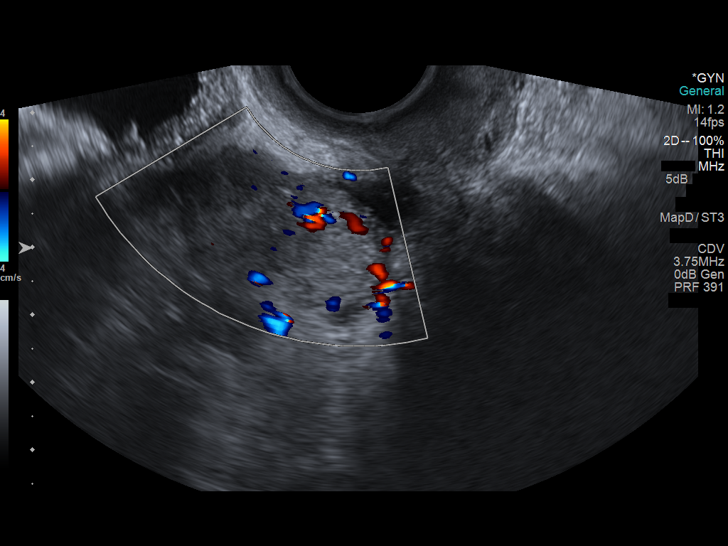
[im 55/74]
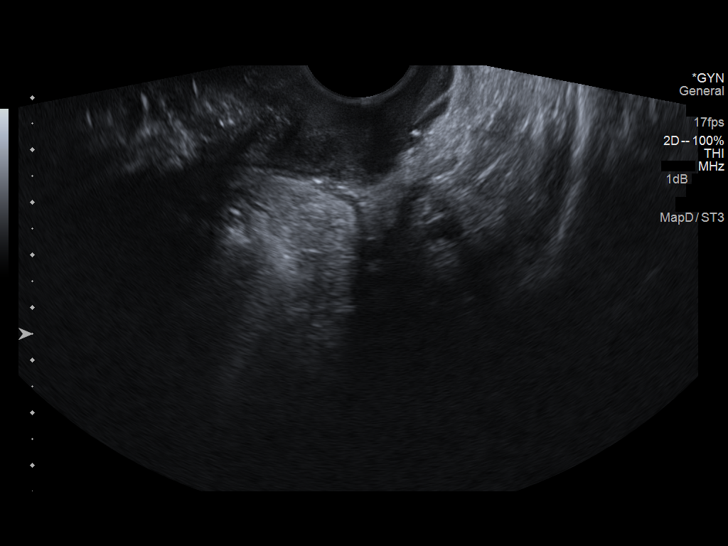
[im 61/74]
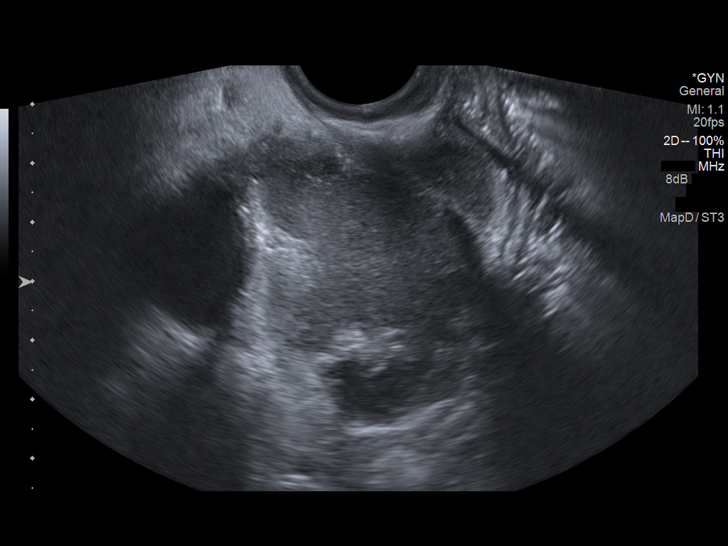
[im 67/74]
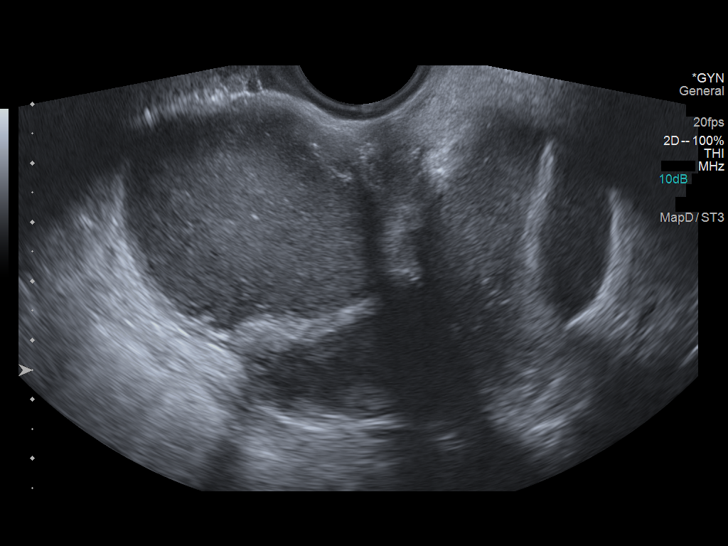
[im 74/74]
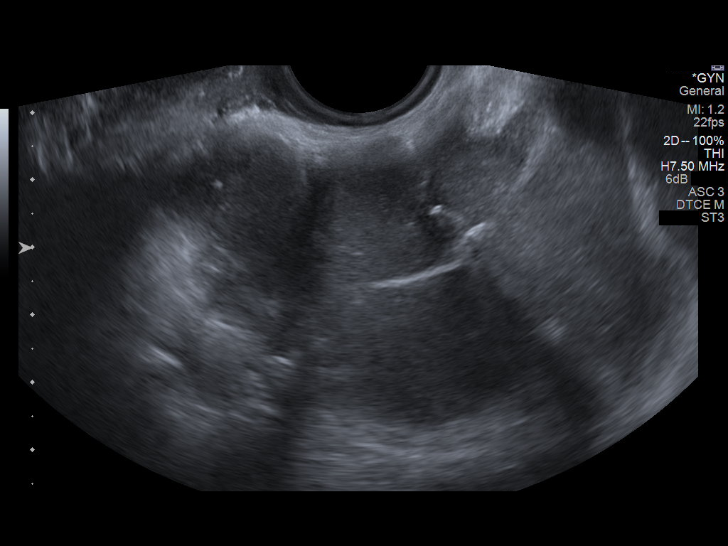

[13 of 25 positions shown; findings below may reference images not displayed]

FINDINGS: Uterus

Surgically absent.

Right ovary

Measurements: 3.6 x 2.1 x 2.7 cm. Small cysts/follicles measuring up
to 1.3 cm.

Left ovary

Measurements: 3.3 x 2.5 x 3.4 cm. 1.9 x 1.6 x 1.5 cm involuting
hemorrhagic cystic lesion.

Other findings

No free fluid.

8.8 x 6.0 x 7.5 cm complex, septated cystic lesion in the right
pelvis, better evaluated on prior MRI.
IMPRESSION: Status post hysterectomy.

1.9 cm involuting hemorrhagic cystic lesion in the left ovary,
likely reflecting a benign hemorrhagic cyst. Bilateral ovaries are
otherwise within normal limits.

8.8 cm complex, septated cystic lesion in right pelvis, better
evaluated on prior MRI. Overall appearance is most suggestive of an
endometrioma, less likely postoperative hematoma/seroma. OB
GYN/surgical consultation is suggested.

## 2016-06-24 ENCOUNTER — Encounter: Payer: Self-pay | Admitting: Internal Medicine

## 2016-06-25 ENCOUNTER — Other Ambulatory Visit: Payer: Self-pay

## 2016-06-25 MED ORDER — METHIMAZOLE 5 MG PO TABS: 5.0000 mg | ORAL_TABLET | Freq: Every day | ORAL | 1 refills | Status: DC

## 2016-06-26 ENCOUNTER — Other Ambulatory Visit (INDEPENDENT_AMBULATORY_CARE_PROVIDER_SITE_OTHER): Payer: 59

## 2016-06-26 DIAGNOSIS — E0500 Thyrotoxicosis with diffuse goiter without thyrotoxic crisis or storm: Secondary | ICD-10-CM | POA: Diagnosis not present

## 2016-06-26 LAB — TSH: TSH: 0.1 u[IU]/mL — ABNORMAL LOW (ref 0.35–4.50)

## 2016-06-26 LAB — T4, FREE: Free T4: 0.82 ng/dL (ref 0.60–1.60)

## 2016-06-26 LAB — T3, FREE: T3, Free: 3.7 pg/mL (ref 2.3–4.2)

## 2016-06-27 NOTE — Telephone Encounter (Signed)
Patient is calling for the result of her labs

## 2016-07-01 MED FILL — methIMAzole 5 MG TABS: 5 | 90 days supply | Qty: 90 | Fill #0

## 2016-07-30 ENCOUNTER — Other Ambulatory Visit (INDEPENDENT_AMBULATORY_CARE_PROVIDER_SITE_OTHER): Payer: 59

## 2016-07-30 DIAGNOSIS — E0500 Thyrotoxicosis with diffuse goiter without thyrotoxic crisis or storm: Secondary | ICD-10-CM

## 2016-07-30 LAB — T3, FREE: T3, Free: 4.1 pg/mL (ref 2.3–4.2)

## 2016-07-30 LAB — TSH: TSH: 0.28 u[IU]/mL — ABNORMAL LOW (ref 0.35–4.50)

## 2016-07-31 LAB — T4, FREE: Free T4: 0.68 ng/dL (ref 0.60–1.60)

## 2016-08-20 DIAGNOSIS — L738 Other specified follicular disorders: Secondary | ICD-10-CM | POA: Diagnosis not present

## 2016-08-20 DIAGNOSIS — B0089 Other herpesviral infection: Secondary | ICD-10-CM | POA: Diagnosis not present

## 2016-08-20 MED FILL — valACYclovir HCL 1 GM TABS: 1 | 30 days supply | Qty: 15 | Fill #0

## 2016-08-27 MED FILL — FLUCONAZOLE 150 MG TABLET: 150 | 1 days supply | Qty: 1 | Fill #0

## 2016-09-13 ENCOUNTER — Ambulatory Visit (INDEPENDENT_AMBULATORY_CARE_PROVIDER_SITE_OTHER): Payer: 59 | Admitting: Internal Medicine

## 2016-09-13 ENCOUNTER — Encounter: Payer: Self-pay | Admitting: Internal Medicine

## 2016-09-13 VITALS — BP 113/92 | HR 61 | Ht 65.5 in | Wt 250.2 lb

## 2016-09-13 DIAGNOSIS — E0500 Thyrotoxicosis with diffuse goiter without thyrotoxic crisis or storm: Secondary | ICD-10-CM | POA: Diagnosis not present

## 2016-09-13 NOTE — Patient Instructions (Signed)
Please come back for labs next week.  Please continue Methimazole 5 mg 1x a day.  Please come back for a follow-up appointment in 6 months.

## 2016-09-13 NOTE — Progress Notes (Addendum)
Patient ID: Tina Eaton, female   DOB: 10-07-1980, 36 y.o.   MRN: [id number]    HPI  Tina Eaton is a 36 y.o.-year-old female, initially referred by her PCP, Dr. Kathyrn Lass, now returning for management of Graves ds. Last visit 4 mo ago.  Reviewed hx: She started to have pbs breathing - worse summer 2017. She saw her PCP who checked several labs including a TSH. This returned abnormal. The other thyroid hormones and the thyroid antibodies were added to the blood in the lab, and they were also abnormal, indicating thyrotoxicosis.  Based on the disease course and her elevated TSI antibodies, we diagnosed Graves' disease and did not do a thyroid uptake and scan.  We started her on methimazole 5 mg twice a day in 02/2016 and she started to feel much better after this. She did not gain weight on the medication, though. Of note, she initially lost 30 pounds in 1.5 years due to her Graves' disease.  We decrease the methimazole dose to 5 mg daily in 05/2016 and another set of TFTs was improved in 07/2016.  I reviewed pt's thyroid tests: Lab Results  Component Value Date   TSH 0.28 (L) 07/30/2016   TSH 0.10 (L) 06/26/2016   TSH 0.22 (L) 05/22/2016   TSH 0.02 (L) 03/21/2016   TSH 0.02 (L) 02/16/2016   TSH 1.02 01/24/2012   FREET4 0.68 07/30/2016   FREET4 0.82 06/26/2016   FREET4 0.59 (L) 05/22/2016   FREET4 1.11 03/21/2016   FREET4 1.80 (H) 02/16/2016  01/31/2016: TSH <0.01, free T4 2.09 (0.61-1.12), total T3 395 (71-180), TSI 310 (0-139%)   She is on biotin >> stopped 1 week ago.   Pt c/o: - + weight gain  Denies: - heat intolerance - tremors - palpitations - anxiety - hyperdefecation - hair loss  Pt denies: - feeling nodules in neck - hoarseness - dysphagia - choking - SOB with lying down  Pt does not have a FH of thyroid ds. No FH of thyroid cancer. No h/o radiation tx to head or neck.  No seaweed or kelp. No recent contrast studies. No herbal supplements. No  recent steroids use.   ROS: Constitutional: + weight gain/no weight loss, no fatigue, no subjective hyperthermia, no subjective hypothermia Eyes: no blurry vision, no xerophthalmia ENT: no sore throat, no nodules palpated in throat, no dysphagia, no odynophagia, no hoarseness Cardiovascular: no CP/no SOB/no palpitations/no leg swelling Respiratory: no cough/no SOB/no wheezing Gastrointestinal: no N/no V/no D/no C/no acid reflux Musculoskeletal: no muscle aches/no joint aches Skin: no rashes, no hair loss Neurological: no tremors/no numbness/no tingling/no dizziness  I reviewed pt's medications, allergies, PMH, social hx, family hx, and changes were documented in the history of present illness. Otherwise, unchanged from my initial visit note.  Past Medical History:  Diagnosis Date  . Anemia   . Mild persistent asthma 01/24/2012  . Post-nasal drip 06/11/2012   Past Surgical History:  Procedure Laterality Date  . ABDOMINAL HYSTERECTOMY  04/15/2010  . CESAREAN SECTION  2004, 2011   Social History   Social History  . Marital status: Single    Spouse name: N/A  . Number of children: 2   Occupational History  . Radiology tech extender, receptionist    Social History Main Topics  . Smoking status: Never Smoker  . Smokeless tobacco: Never Used  . Alcohol use No  . Drug use: No  . Sexual activity: Yes    Birth control/ protection: Surgical - TAH  Current Outpatient Prescriptions on File Prior to Visit  Medication Sig Dispense Refill  . albuterol (PROAIR HFA) 108 (90 Base) MCG/ACT inhaler 2 puffs as needed    . fluticasone (FLOVENT HFA) 110 MCG/ACT inhaler 1 puff    . methimazole (TAPAZOLE) 5 MG tablet Take 1 tablet (5 mg total) by mouth daily. 90 tablet 1  . Multiple Vitamin (MULTIVITAMIN) tablet Take 1 tablet by mouth daily.    . Omega-3 Fatty Acids (FISH OIL PO) Take by mouth.    . valACYclovir (VALTREX) 1000 MG tablet 2 tablets     No current facility-administered  medications on file prior to visit.    Allergies  Allergen Reactions  . Amoxicillin     Other reaction(s): sick on stomach, vomiting  . Latex Itching    Itching and redness to affected site   Family History  Problem Relation Age of Onset  . Cancer Other   . Asthma Neg Hx   . Diabetes Neg Hx   . Early death Neg Hx   . Heart disease Neg Hx   . Hyperlipidemia Neg Hx   . Hypertension Neg Hx   . Kidney disease Neg Hx   . Stroke Neg Hx    She has diabetes, HTN, HL in aunts and uncles, heart problems in aunts and cancer in grandmother aunts and uncles  PE: BP (!) 113/92   Pulse 61   Ht 5' 5.5" (1.664 m)   Wt 250 lb 3.2 oz (113.5 kg)   LMP 08/14/2010   SpO2 99%   BMI 41.00 kg/m  Wt Readings from Last 3 Encounters:  09/13/16 250 lb 3.2 oz (113.5 kg)  05/17/16 248 lb (112.5 kg)  02/16/16 232 lb (105.2 kg)   Constitutional: Obese, in NAD Eyes: PERRLA, EOMI, no exophthalmos ENT: moist mucous membranes, + symmetric, smooth, thyromegaly, no cervical lymphadenopathy Cardiovascular: RRR, No MRG Respiratory: CTA B Gastrointestinal: abdomen soft, NT, ND, BS+ Musculoskeletal: no deformities, strength intact in all 4 Skin: moist, warm, no rashes Neurological: no tremor with outstretched hands, DTR normal in all 4   ASSESSMENT: 1. Graves ds.  PLAN:  1. Patient with relatively recent dx of Graves ds (dx'ed based on disease course, symptoms and elevated TSI Abs), with initial weight loss of 30 lbs, but no other thyrotoxic EZM:OQHU intolerance, hypodensity education, palpitations, tremors, anxiety. However, she started to feel much better after we started methimazole and she is still asymptomatic even after we decreased the dose to 5 mg daily after last visit. Her only complaint is weight gain. However, compared to last visit, she only gained 2 pounds by our scale. - Since she is off the biotin for the last week, we can check TSH, free T4, free T3. Since we do not have levels available  today, she will return for these labs early next week. - We discussed that she is responding very well to methimazole and we may be able to stop the medication soon or continue at the very low dose of 2.5 mg daily or every other day, dose which has been demonstrated to be safe and effective in preventing Graves' disease recurrence - No need to start beta blockers as she is not tachycardic or tremulous - RTC in 6 months, but sooner for repeat labs   Component     Latest Ref Rng & Units 09/16/2016  TSH     0.35 - 4.50 uIU/mL 0.49  T4,Free(Direct)     0.60 - 1.60 ng/dL 0.78  Triiodothyronine,Free,Serum  2.3 - 4.2 pg/mL 3.8   TFTs now normal >> continue MMI 5 mg daily for now. Will continue same dose and check labs in 3 mo.  Philemon Kingdom, MD PhD [facility] Endocrinology

## 2016-09-16 ENCOUNTER — Other Ambulatory Visit (INDEPENDENT_AMBULATORY_CARE_PROVIDER_SITE_OTHER): Payer: 59

## 2016-09-16 DIAGNOSIS — E0500 Thyrotoxicosis with diffuse goiter without thyrotoxic crisis or storm: Secondary | ICD-10-CM | POA: Diagnosis not present

## 2016-09-16 LAB — T3, FREE: T3, Free: 3.8 pg/mL (ref 2.3–4.2)

## 2016-09-16 LAB — TSH: TSH: 0.49 u[IU]/mL (ref 0.35–4.50)

## 2016-09-16 LAB — T4, FREE: Free T4: 0.78 ng/dL (ref 0.60–1.60)

## 2016-09-16 NOTE — Addendum Note (Signed)
Addended by: Philemon Kingdom on: 09/16/2016 12:27 PM   Modules accepted: Orders

## 2016-10-02 MED FILL — methIMAzole 5 MG TABS: 5 | 90 days supply | Qty: 90 | Fill #1

## 2016-12-30 ENCOUNTER — Telehealth: Payer: Self-pay | Admitting: Internal Medicine

## 2016-12-30 ENCOUNTER — Other Ambulatory Visit: Payer: Self-pay

## 2016-12-30 ENCOUNTER — Encounter: Payer: Self-pay | Admitting: Internal Medicine

## 2016-12-30 MED ORDER — METHIMAZOLE 5 MG PO TABS: 5.0000 mg | ORAL_TABLET | Freq: Every day | ORAL | 1 refills | Status: DC

## 2016-12-30 MED FILL — methIMAzole 5 MG TABS: 5 | 90 days supply | Qty: 90 | Fill #0

## 2016-12-30 NOTE — Telephone Encounter (Signed)
Attempted to contact patient to advise that I resubmitted to [facility] after I realized it was sent to the wrong one. They should have it now. Patient did not answer and no voicemail to leave message.

## 2016-12-30 NOTE — Telephone Encounter (Signed)
Patient called in reference to Rx being sent to wrong pharmacy. Patient stated it was sent to [facility]. Please call patient and advise.

## 2017-01-01 MED FILL — valACYclovir HCL 1 GM TABS: 1 | 30 days supply | Qty: 15 | Fill #1

## 2017-02-11 ENCOUNTER — Other Ambulatory Visit: Payer: Self-pay | Admitting: Obstetrics and Gynecology

## 2017-02-11 DIAGNOSIS — Z1231 Encounter for screening mammogram for malignant neoplasm of breast: Secondary | ICD-10-CM

## 2017-03-14 ENCOUNTER — Ambulatory Visit
Admission: RE | Admit: 2017-03-14 | Discharge: 2017-03-14 | Disposition: A | Discharge status: Home / Self Care | Payer: 59 | Source: Ambulatory Visit | Attending: Obstetrics and Gynecology | Admitting: Obstetrics and Gynecology

## 2017-03-14 DIAGNOSIS — Z1231 Encounter for screening mammogram for malignant neoplasm of breast: Secondary | ICD-10-CM

## 2017-03-14 MED FILL — valACYclovir HCL 1 GM TABS: 1 | 30 days supply | Qty: 15 | Fill #2

## 2017-03-17 ENCOUNTER — Other Ambulatory Visit: Payer: Self-pay | Admitting: Obstetrics and Gynecology

## 2017-03-17 ENCOUNTER — Encounter: Payer: Self-pay | Admitting: Internal Medicine

## 2017-03-17 ENCOUNTER — Ambulatory Visit (INDEPENDENT_AMBULATORY_CARE_PROVIDER_SITE_OTHER): Payer: 59 | Admitting: Internal Medicine

## 2017-03-17 VITALS — BP 124/90 | HR 77 | Wt 252.6 lb

## 2017-03-17 DIAGNOSIS — R928 Other abnormal and inconclusive findings on diagnostic imaging of breast: Secondary | ICD-10-CM

## 2017-03-17 DIAGNOSIS — E0500 Thyrotoxicosis with diffuse goiter without thyrotoxic crisis or storm: Secondary | ICD-10-CM | POA: Diagnosis not present

## 2017-03-17 LAB — T4, FREE: Free T4: 0.83 ng/dL (ref 0.60–1.60)

## 2017-03-17 LAB — TSH: TSH: 1.87 u[IU]/mL (ref 0.35–4.50)

## 2017-03-17 LAB — T3, FREE: T3, Free: 4.2 pg/mL (ref 2.3–4.2)

## 2017-03-17 NOTE — Patient Instructions (Addendum)
Please stop at the lab.  Please continue Methimazole 5 mg 1x a day.  Please come back for a follow-up appointment in 1 year and for labs in 6 months.

## 2017-03-17 NOTE — Progress Notes (Addendum)
Patient ID: Tina Eaton, female   DOB: 12/17/1980, 36 y.o.   MRN: [id number]    HPI  Tina Eaton is a 36 y.o.-year-old female, initially referred by her PCP, Dr. Kathyrn Lass, now returning for management of Graves ds. Last visit 6 months ago.  Reviewed history: She started to have pbs breathing - worse summer 2017. She saw her PCP who checked several labs including a TSH. This returned abnormal. The other thyroid hormones and the thyroid antibodies were added to the blood in the lab, and they were also abnormal, indicating thyrotoxicosis.  Based on the disease course and her elevated TSI antibodies, we diagnosed Graves' disease and did not do a thyroid uptake and scan.  We started her on methimazole 5 mg twice a day in 02/2016 and she started to feel much better after this. She did not gain weight on the medication, though. Of note, she initially lost 30 pounds in 1.5 years due to her Graves' disease.  We decreased the methimazole dose to 5 mg daily in 05/2016.  I reviewed pt's thyroid tests -last set of tests normal: Lab Results  Component Value Date   TSH 0.49 09/16/2016   TSH 0.28 (L) 07/30/2016   TSH 0.10 (L) 06/26/2016   TSH 0.22 (L) 05/22/2016   TSH 0.02 (L) 03/21/2016   TSH 0.02 (L) 02/16/2016   TSH 1.02 01/24/2012   FREET4 0.78 09/16/2016   FREET4 0.68 07/30/2016   FREET4 0.82 06/26/2016   FREET4 0.59 (L) 05/22/2016   FREET4 1.11 03/21/2016   FREET4 1.80 (H) 02/16/2016  01/31/2016: TSH <0.01, free T4 2.09 (0.61-1.12), total T3 395 (71-180), TSI 310 (0-139%)  No results found for: TSI   She is on biotin >> stopped 1 week ago.  Pt denies: - weight loss - heat intolerance - tremors - palpitations - anxiety - hyperdefecation - hair loss  Pt denies: - feeling nodules in neck - hoarseness - dysphagia - choking - SOB with lying down  Pt does not have a FH of thyroid ds. No FH of thyroid cancer. No h/o radiation tx to head or neck.  No seaweed or kelp.  No recent contrast studies. No herbal supplements. No Biotin use. No recent steroids use.   ROS: Constitutional: + see HPI Eyes: no blurry vision, no xerophthalmia ENT: no sore throat, + see HPI Cardiovascular: no CP/no SOB/no palpitations/no leg swelling Respiratory: no cough/no SOB/no wheezing Gastrointestinal: no N/no V/no D/no C/no acid reflux Musculoskeletal: no muscle aches/no joint aches Skin: no rashes, no hair loss Neurological: no tremors/no numbness/no tingling/no dizziness  I reviewed pt's medications, allergies, PMH, social hx, family hx, and changes were documented in the history of present illness. Otherwise, unchanged from my initial visit note.  Past Medical History:  Diagnosis Date  . Anemia   . Mild persistent asthma 01/24/2012  . Post-nasal drip 06/11/2012   Past Surgical History:  Procedure Laterality Date  . ABDOMINAL HYSTERECTOMY  04/15/2010  . CESAREAN SECTION  2004, 2011   Social History   Social History  . Marital status: Single    Spouse name: N/A  . Number of children: 2   Occupational History  . Radiology tech extender, receptionist    Social History Main Topics  . Smoking status: Never Smoker  . Smokeless tobacco: Never Used  . Alcohol use No  . Drug use: No  . Sexual activity: Yes    Birth control/ protection: Surgical - TAH   Current Outpatient Medications on File Prior  to Visit  Medication Sig Dispense Refill  . albuterol (PROAIR HFA) 108 (90 Base) MCG/ACT inhaler 2 puffs as needed    . fluticasone (FLOVENT HFA) 110 MCG/ACT inhaler 1 puff    . methimazole (TAPAZOLE) 5 MG tablet Take 1 tablet (5 mg total) by mouth daily. 90 tablet 1  . Multiple Vitamin (MULTIVITAMIN) tablet Take 1 tablet by mouth daily.    . Omega-3 Fatty Acids (FISH OIL PO) Take by mouth.    . valACYclovir (VALTREX) 1000 MG tablet 2 tablets     No current facility-administered medications on file prior to visit.    Allergies  Allergen Reactions  . Amoxicillin      Other reaction(s): sick on stomach, vomiting  . Latex Itching    Itching and redness to affected site   Family History  Problem Relation Age of Onset  . Breast cancer Maternal Grandmother        Pt unsure of age of onset  . Asthma Neg Hx   . Diabetes Neg Hx   . Early death Neg Hx   . Heart disease Neg Hx   . Hyperlipidemia Neg Hx   . Hypertension Neg Hx   . Kidney disease Neg Hx   . Stroke Neg Hx    She has diabetes, HTN, HL in aunts and uncles, heart problems in aunts and cancer in grandmother aunts and uncles  PE: BP 124/90   Pulse 77   Wt 252 lb 9.6 oz (114.6 kg)   LMP 08/14/2010   SpO2 98%   BMI 41.40 kg/m  Wt Readings from Last 3 Encounters:  03/17/17 252 lb 9.6 oz (114.6 kg)  09/13/16 250 lb 3.2 oz (113.5 kg)  05/17/16 248 lb (112.5 kg)   Constitutional: obese, in NAD Eyes: PERRLA, EOMI, no exophthalmos ENT: moist mucous membranes, + symmetric, smooth, thyromegaly, no cervical lymphadenopathy Cardiovascular: RRR, No MRG Respiratory: CTA B Gastrointestinal: abdomen soft, NT, ND, BS+ Musculoskeletal: no deformities, strength intact in all 4 Skin: moist, warm, no rashes Neurological: no tremor with outstretched hands, DTR normal in all 4  ASSESSMENT: 1. Graves ds.  PLAN:  1. Patient with relatively recent diagnosis of Graves' disease, diagnosed based on disease course, symptoms, and elevated TSI antibodies, with initial weight loss of 30 pounds but no other thyrotoxic symptoms.  She started to feel much better after we started methimazole and she is now asymptomatic on 5 mg daily. She has no side effects from methimazole. - She is on biotin, which she stopped approximately a week ago so we can check her TSH, free T4, free T3 today.  We will also add TSI antibodies to check for Graves' disease activity. - We again discussed that she is responding very well to methimazole and we may be able to stop the medication sooner continue with the very low dose of 2.5 mg  daily or every other day, the dose which has been demonstrated to be safe and effective in preventing Graves' disease recurrence.   - No need to start the beta-blocker since she is not tremulous, tachycardic or anxious - RTC in 6 months, but sooner for repeat labs.  - time spent with the patient: 15 min, of which >50% was spent in obtaining information about her symptoms, reviewing her previous labs, evaluations, and treatments, counseling her about her condition (please see the discussed topics above), and developing a plan to further investigate and treat it.  Component     Latest Ref Rng & Units 03/17/2017  Triiodothyronine,Free,Serum     2.3 - 4.2 pg/mL 4.2  T4,Free(Direct)     0.60 - 1.60 ng/dL 0.83  TSH     0.35 - 4.50 uIU/mL 1.87  TSI     <140 % baseline 190 (H)   TFTs normal. TSIs high, but improved from 310  1 year ago. Would continue the same dose of Methimazole (would not like to decrease now especially as fT3 is at the ULN). Will consider decreasing this at next visit.  Philemon Kingdom, MD PhD [facility] Endocrinology

## 2017-03-20 ENCOUNTER — Other Ambulatory Visit: Payer: Self-pay | Admitting: Obstetrics and Gynecology

## 2017-03-20 ENCOUNTER — Ambulatory Visit
Admission: RE | Admit: 2017-03-20 | Discharge: 2017-03-20 | Disposition: A | Discharge status: Home / Self Care | Payer: 59 | Source: Ambulatory Visit | Attending: Obstetrics and Gynecology | Admitting: Obstetrics and Gynecology

## 2017-03-20 DIAGNOSIS — R928 Other abnormal and inconclusive findings on diagnostic imaging of breast: Secondary | ICD-10-CM

## 2017-03-20 DIAGNOSIS — R922 Inconclusive mammogram: Secondary | ICD-10-CM | POA: Diagnosis not present

## 2017-03-20 DIAGNOSIS — N6320 Unspecified lump in the left breast, unspecified quadrant: Secondary | ICD-10-CM

## 2017-03-20 DIAGNOSIS — N6324 Unspecified lump in the left breast, lower inner quadrant: Secondary | ICD-10-CM | POA: Diagnosis not present

## 2017-03-20 LAB — THYROID STIMULATING IMMUNOGLOBULIN: TSI: 190 % baseline — ABNORMAL HIGH (ref <140)

## 2017-03-25 ENCOUNTER — Other Ambulatory Visit: Payer: Self-pay | Admitting: Obstetrics and Gynecology

## 2017-03-25 DIAGNOSIS — N6320 Unspecified lump in the left breast, unspecified quadrant: Secondary | ICD-10-CM

## 2017-03-26 ENCOUNTER — Ambulatory Visit
Admission: RE | Admit: 2017-03-26 | Discharge: 2017-03-26 | Disposition: A | Discharge status: Home / Self Care | Payer: 59 | Source: Ambulatory Visit | Attending: Obstetrics and Gynecology | Admitting: Obstetrics and Gynecology

## 2017-03-26 DIAGNOSIS — D242 Benign neoplasm of left breast: Secondary | ICD-10-CM | POA: Diagnosis not present

## 2017-03-26 DIAGNOSIS — N6320 Unspecified lump in the left breast, unspecified quadrant: Secondary | ICD-10-CM

## 2017-03-26 DIAGNOSIS — N6324 Unspecified lump in the left breast, lower inner quadrant: Secondary | ICD-10-CM | POA: Diagnosis not present

## 2017-04-01 DIAGNOSIS — Z6841 Body Mass Index (BMI) 40.0 and over, adult: Secondary | ICD-10-CM | POA: Diagnosis not present

## 2017-04-01 DIAGNOSIS — Z01419 Encounter for gynecological examination (general) (routine) without abnormal findings: Secondary | ICD-10-CM | POA: Diagnosis not present

## 2017-04-02 ENCOUNTER — Other Ambulatory Visit (HOSPITAL_COMMUNITY): Payer: Self-pay | Admitting: Obstetrics and Gynecology

## 2017-04-02 DIAGNOSIS — R109 Unspecified abdominal pain: Secondary | ICD-10-CM

## 2017-04-03 ENCOUNTER — Ambulatory Visit (HOSPITAL_COMMUNITY)
Admission: RE | Admit: 2017-04-03 | Discharge: 2017-04-03 | Disposition: A | Discharge status: Home / Self Care | Payer: 59 | Source: Ambulatory Visit | Attending: Obstetrics and Gynecology | Admitting: Obstetrics and Gynecology

## 2017-04-03 DIAGNOSIS — R1903 Right lower quadrant abdominal swelling, mass and lump: Secondary | ICD-10-CM | POA: Insufficient documentation

## 2017-04-03 DIAGNOSIS — R1900 Intra-abdominal and pelvic swelling, mass and lump, unspecified site: Secondary | ICD-10-CM | POA: Diagnosis not present

## 2017-04-03 DIAGNOSIS — R109 Unspecified abdominal pain: Secondary | ICD-10-CM

## 2017-04-03 MED ORDER — GADOBENATE DIMEGLUMINE 529 MG/ML IV SOLN
20.0000 mL | Freq: Once | INTRAVENOUS | Status: AC | PRN
  Administered 2017-04-03: 20 mL via INTRAVENOUS

## 2017-04-03 MED FILL — methIMAzole 5 MG TABS: 5 | 90 days supply | Qty: 90 | Fill #1

## 2017-04-09 ENCOUNTER — Telehealth: Payer: Self-pay | Admitting: *Deleted

## 2017-04-09 NOTE — Telephone Encounter (Signed)
Called and moved the patient's appt from 12pm to 10:30am

## 2017-04-16 ENCOUNTER — Encounter: Payer: Self-pay | Admitting: Gynecologic Oncology

## 2017-04-16 ENCOUNTER — Ambulatory Visit: Payer: 59 | Attending: Gynecologic Oncology | Admitting: Gynecologic Oncology

## 2017-04-16 VITALS — BP 135/94 | HR 76 | Temp 98.7°F | Resp 20 | Ht 65.0 in | Wt 252.5 lb

## 2017-04-16 DIAGNOSIS — Z880 Allergy status to penicillin: Secondary | ICD-10-CM | POA: Insufficient documentation

## 2017-04-16 DIAGNOSIS — Z90710 Acquired absence of both cervix and uterus: Secondary | ICD-10-CM | POA: Diagnosis not present

## 2017-04-16 DIAGNOSIS — R1900 Intra-abdominal and pelvic swelling, mass and lump, unspecified site: Secondary | ICD-10-CM | POA: Diagnosis not present

## 2017-04-16 DIAGNOSIS — Z79899 Other long term (current) drug therapy: Secondary | ICD-10-CM | POA: Diagnosis not present

## 2017-04-16 NOTE — Patient Instructions (Signed)
Plan to follow up with Dr. Sabra Heck about your blood pressure.  Please call the office at [phone] if you would like to proceed with surgery.  Please call for any questions or concerns.

## 2017-04-16 NOTE — Progress Notes (Signed)
Consult Note: Gyn-Onc  Tina Eaton 37 y.o. female  CC:  Chief Complaint  Patient presents with  . pelvic mass    HPI: Patient is referred back to [place] today from Dr. Gaetano Net.  She was last seen by Dr. Denman George in April 2016 we had not seen her since that time. Her history at that time was that she had abdominal pain and imaging including ultrasound and MRI been performed. The MRI reveals a ovoid cystic lesion in the left adnexa measuring 8.5 x 5 x 4.3 cm. There are no septations or nodularity. In addition there is a complex cystic lesion centered in the right perirectal presacral space posterior to the vaginal cuff measuring 7.2 x 8.1 x 7.8 cm. It displaces the rectum to the left there were no solid areas or nodules although there are's thick septations to this mass. Aside from pain the patient denies any other symptoms.  She had been scheduled for surgery in January 2016 and became reluctant about undergoing surgery and she canceled her January surgical date and requested a follow-up ultrasound scan which was performed on 05/09/2014. It showed a left ovary measuring 3.7 x 2.6 x 3.1 cm with an associated left ovarian cyst measuring 7.6 x 3.9 x 3.1 cm. It had not not changed significantly to the prior study. It did not appear complex. There was no evidence of free fluid or solid adnexal masses. The right perirectal cyst identified previously was not visualized. The right ovary was normal in appearance measuring 3 x 2.5 x 1.8 cm  Repeat US on Apri 27th, 2016 showed a normal right ovary measuring 3.6x2.1x2.7cm with an associated complex cyst measuring 8.8x6x7.5cm which is complex and septated. It appears consistent with an endometrioma. The left ovary is normal measuring 3.2 x 2.5 x 3.4 cm.  Patient has a past history of undergoing a laparoscopically assisted vaginal hysterectomy in June 2012 for uterine fibroids. The uterus weighed 235 g. Patient is also had 2 prior cesarean sections.  She was  seen again by Dr. Gertie Fey and was complaining of more pressure and abdominal discomfort. She is now interested in having surgery. She underwent an MRI of the pelvis on April 03, 2017 that revealed: Vascular/Lymphatic: No suspicious pelvic lymphadenopathy.  Reproductive:  Status post hysterectomy.  8.0 x 7.2 cm multiloculated cystic lesion in the right presacral region with intrinsic T1 hyperintensity/hemorrhage, T2 shading, and peripheral rim enhancement. This remains most compatible with endometriosis, previously measuring 7.7 x 7.8 cm in 2015, grossly unchanged.  Bilateral ovaries are within normal limits.  Other:  No pelvic ascites.  Postsurgical changes involving the lower anterior abdominal wall.  Tiny fat containing right inguinal hernia.  Musculoskeletal: No focal osseous lesions.  IMPRESSION: 8.0 cm multiloculated cystic lesion in the right presacral region, grossly unchanged from 2015, most suggestive of endometriosis.  Interval History:  She states that her biggest complaint is some dyspareunia and it does appear to be positional. There is no vaginal dryness. She does not really have pain after intercourse is just during intercourse. Occasionally she'll have some bilateral sharp abdominal pains. This is intermittent and has been going on for the past few months. It's really no different in her recollection from the symptoms she had in 2016. She denies any change in her bowel or bladder habits. She does have a primary care physician Dr. Kathyrn [facility] at [facility].  Review of Systems: Constitutional:Denies fever. Cardiovascular: No chest pain, shortness of breath, or edema  Gastro Intestinal: Reporting intermittent lower  abdominal soreness.  No nausea, vomiting, constipation, or diarrhea reported.  Genitourinary: No frequency, urgency, or dysuria.  Denies vaginal bleeding and discharge. + dyspareunia  Current Meds:  Outpatient Encounter Medications as of 04/16/2017   Medication Sig  . albuterol (PROAIR HFA) 108 (90 Base) MCG/ACT inhaler 2 puffs as needed  . fluticasone (FLOVENT HFA) 110 MCG/ACT inhaler 1 puff  . methimazole (TAPAZOLE) 5 MG tablet Take 1 tablet (5 mg total) by mouth daily.  . Multiple Vitamin (MULTIVITAMIN) tablet Take 1 tablet by mouth daily.  . Omega-3 Fatty Acids (FISH OIL PO) Take by mouth.  . valACYclovir (VALTREX) 1000 MG tablet 2 tablets   No facility-administered encounter medications on file as of 04/16/2017.     Allergy:  Allergies  Allergen Reactions  . Amoxicillin     Other reaction(s): sick on stomach, vomiting  . Latex Itching    Itching and redness to affected site    Social Hx:   Social History   Socioeconomic History  . Marital status: Married    Spouse name: Not on file  . Number of children: Not on file  . Years of education: Not on file  . Highest education level: Not on file  Social Needs  . Financial resource strain: Not on file  . Food insecurity - worry: Not on file  . Food insecurity - inability: Not on file  . Transportation needs - medical: Not on file  . Transportation needs - non-medical: Not on file  Occupational History  . Not on file  Tobacco Use  . Smoking status: Never Smoker  . Smokeless tobacco: Never Used  Substance and Sexual Activity  . Alcohol use: No  . Drug use: No  . Sexual activity: Yes    Birth control/protection: Surgical  Other Topics Concern  . Not on file  Social History Narrative  . Not on file    Past Surgical Hx:  Past Surgical History:  Procedure Laterality Date  . ABDOMINAL HYSTERECTOMY  04/15/2010  . CESAREAN SECTION  2004, 2011    Past Medical Hx:  Past Medical History:  Diagnosis Date  . Anemia   . Mild persistent asthma 01/24/2012  . Post-nasal drip 06/11/2012    Oncology Hx:   No history exists.    Family Hx:  Family History  Problem Relation Age of Onset  . Breast cancer Maternal Grandmother        Pt unsure of age of onset  .  Asthma Neg Hx   . Diabetes Neg Hx   . Early death Neg Hx   . Heart disease Neg Hx   . Hyperlipidemia Neg Hx   . Hypertension Neg Hx   . Kidney disease Neg Hx   . Stroke Neg Hx     Vitals:  Blood pressure (!) 135/94, pulse 76, temperature 98.7 F (37.1 C), temperature source Oral, resp. rate 20, height 5\' 5"  (1.651 m), weight 252 lb 8 oz (114.5 kg), last menstrual period 08/14/2010, SpO2 100 %.  Physical Exam: Well-nourished well-developed female in no acute distress.  Abdomen morbidly obese. Well-healed transverse abdominal incisions 2 in the lower aspect. Well-healed infraumbilical incision. Abdomen is soft, nontender, nondistended. There are no palpable masses. There is no fluid wave.  Pelvic: External genitalia within normal limits. On bimanual examination which is somewhat limited by habitus there is a posterior mass has felt better on vaginal examination. It is smooth and nontender. On rectovaginal examination I cannot appreciate the mass distinctly. There is no rectovaginal septum  involvement. There is no nodularity.  Assessment/Plan: 37 year old who comes in today for a persistent pelvic mass. She had been counseled regarding surgery about 2 years ago and decided to opt for close follow-up. She now wanted reconsultation to discuss that as she has renewed symptoms which is similar to those in 2016. I discussed with them that this most likely does not represent a malignancy as it has not changed in about 2 years. If anything, things are better as the left complex adnexal mass she previously had this completely resolved. I discussed with her that we could attempt to do this in a minimally invasive fashion. The technical dfficulty would really be something we could not completely ascertain until the time of surgery depending on whether this is retroperitoneal in nature. We discussed possibility of involvement of the rectum as well as the bilateral ureters. We discussed the risks of adhesive  disease from her prior surgery. They wanted to know if there were not surgical options. I discussed with them that birth control pills can be used in a biopsy-proven endometriosis however with her age and her blood pressure this would not be something that we can consider until she manages her hypertension. We discussed Lupron but again as this has been so chronic I'm not sure Lupron would be as effective as this may not be completely active endometriosis and may be more burned-out endometriosis which is causing her symptoms.  Her questions as well as those of her husband were elicited in answer to their satisfaction. She will take responsibility following up with Dr. Sabra Heck for management of her blood pressure. Discussed with her that this can involve diet and weight loss or could involve medication. She and her husband will continue to consider surgical options. They have been given our contact information again noted to call us if they have any questions or if she wishes to discuss surgery in  more detail.  She also questions about this turning into cancer. This was discussed with them that you can develop certain types of cancers with an endometriosis but that does not appear to be the issue at this time and is a fairly uncommon event. However even if we remove this mass would not decrease the risk 100% and she could still develop ovarian cancer.  Duanna Runk A., MD 04/16/2017, 10:53 AM

## 2017-07-03 ENCOUNTER — Other Ambulatory Visit: Payer: Self-pay | Admitting: Internal Medicine

## 2017-07-03 MED FILL — valACYclovir HCL 1 GM TABS: 1 | 30 days supply | Qty: 15 | Fill #3

## 2017-07-04 MED FILL — methIMAzole 5 MG TABS: 5 | 90 days supply | Qty: 90 | Fill #0

## 2017-08-08 DIAGNOSIS — R030 Elevated blood-pressure reading, without diagnosis of hypertension: Secondary | ICD-10-CM | POA: Diagnosis not present

## 2017-08-08 DIAGNOSIS — Z0000 Encounter for general adult medical examination without abnormal findings: Secondary | ICD-10-CM | POA: Diagnosis not present

## 2017-08-08 DIAGNOSIS — E0590 Thyrotoxicosis, unspecified without thyrotoxic crisis or storm: Secondary | ICD-10-CM | POA: Diagnosis not present

## 2017-08-08 DIAGNOSIS — J4530 Mild persistent asthma, uncomplicated: Secondary | ICD-10-CM | POA: Diagnosis not present

## 2017-09-15 ENCOUNTER — Other Ambulatory Visit: Payer: 59

## 2017-09-16 ENCOUNTER — Other Ambulatory Visit (INDEPENDENT_AMBULATORY_CARE_PROVIDER_SITE_OTHER): Payer: 59

## 2017-09-16 DIAGNOSIS — E0500 Thyrotoxicosis with diffuse goiter without thyrotoxic crisis or storm: Secondary | ICD-10-CM

## 2017-09-16 LAB — T3, FREE: T3, Free: 3.8 pg/mL (ref 2.3–4.2)

## 2017-09-16 LAB — TSH: TSH: 1.53 u[IU]/mL (ref 0.35–4.50)

## 2017-09-16 LAB — T4, FREE: Free T4: 0.87 ng/dL (ref 0.60–1.60)

## 2017-10-02 MED FILL — methIMAzole 5 MG TABS: 5 | 90 days supply | Qty: 90 | Fill #1

## 2018-01-02 ENCOUNTER — Encounter: Payer: Self-pay | Admitting: Internal Medicine

## 2018-01-02 MED ORDER — METHIMAZOLE 5 MG PO TABS: 5.0000 mg | ORAL_TABLET | Freq: Every day | ORAL | 1 refills | Status: DC

## 2018-01-02 MED FILL — methIMAzole 5 MG TABS: 5 | 90 days supply | Qty: 90 | Fill #0

## 2018-01-16 MED FILL — valACYclovir HCL 1 GM TABS: 1 | 30 days supply | Qty: 15 | Fill #0

## 2018-03-17 ENCOUNTER — Encounter: Payer: Self-pay | Admitting: Internal Medicine

## 2018-03-17 ENCOUNTER — Ambulatory Visit (INDEPENDENT_AMBULATORY_CARE_PROVIDER_SITE_OTHER): Payer: 59 | Admitting: Internal Medicine

## 2018-03-17 VITALS — BP 128/60 | HR 68 | Ht 65.0 in | Wt 258.0 lb

## 2018-03-17 DIAGNOSIS — E0500 Thyrotoxicosis with diffuse goiter without thyrotoxic crisis or storm: Secondary | ICD-10-CM

## 2018-03-17 LAB — T4, FREE: Free T4: 0.72 ng/dL (ref 0.60–1.60)

## 2018-03-17 LAB — T3, FREE: T3, Free: 3.8 pg/mL (ref 2.3–4.2)

## 2018-03-17 LAB — TSH: TSH: 2.5 u[IU]/mL (ref 0.35–4.50)

## 2018-03-17 NOTE — Progress Notes (Signed)
Patient ID: Tina Eaton, female   DOB: [birth year], 37 y.o.   MRN: [id number]    HPI  Tina Eaton is a 37 y.o.-year-old female, initially referred by her PCP, Dr. Kathyrn Lass, now returning for management of Graves ds. Last visit 1 year ago.  She has been working 7 days a week for last 4 mo >> she feels tired, dizzy.  Reviewed history. She started to have pbs breathing - worse summer 2017. She saw her PCP who checked several labs including a TSH. This returned abnormal. The other thyroid hormones and the thyroid antibodies were added to the blood in the lab, and they were also abnormal, indicating thyrotoxicosis.  Based on the disease course and her elevated TSI antibodies, we diagnosed Graves' disease and did not do a thyroid uptake and scan.  We started her on methimazole 5 mg twice a day in 02/2016 and she started to feel much better after this. She did not gain weight on the medication, though. Of note, she initially lost 30 pounds in 1.5 years due to her Graves' disease.  We decreased the methimazole dose to 5 mg daily in 05/2016.  Reviewed patient's TFTs, they were normal for the last 1.5 years. Lab Results  Component Value Date   TSH 1.53 09/16/2017   TSH 1.87 03/17/2017   TSH 0.49 09/16/2016   TSH 0.28 (L) 07/30/2016   TSH 0.10 (L) 06/26/2016   TSH 0.22 (L) 05/22/2016   TSH 0.02 (L) 03/21/2016   TSH 0.02 (L) 02/16/2016   TSH 1.02 01/24/2012   FREET4 0.87 09/16/2017   FREET4 0.83 03/17/2017   FREET4 0.78 09/16/2016   FREET4 0.68 07/30/2016   FREET4 0.82 06/26/2016   FREET4 0.59 (L) 05/22/2016   FREET4 1.11 03/21/2016   FREET4 1.80 (H) 02/16/2016  01/31/2016: TSH <0.01, free T4 2.09 (0.61-1.12), total T3 395 (71-180), TSI 310 (0-139%)  Lab Results  Component Value Date   TSI 190 (H) 03/17/2017    She is on biotin but stopped several months ago..  Pt denies: - weight loss, but had weight gain - heat intolerance - tremors - palpitations - anxiety -  hyperdefecation  Pt denies: - feeling nodules in neck - hoarseness - dysphagia - choking - SOB with lying down  Pt does not have a FH of thyroid ds. No FH of thyroid cancer. No h/o radiation tx to head or neck.  No seaweed or kelp. No recent contrast studies. No herbal supplements. No Biotin use. No recent steroids use.   ROS: Constitutional: no weight gain/no weight loss, no fatigue, no subjective hyperthermia, no subjective hypothermia Eyes: no blurry vision, no xerophthalmia ENT: no sore throat, + see HPI Cardiovascular: no CP/no SOB/no palpitations/no leg swelling Respiratory: no cough/no SOB/no wheezing Gastrointestinal: no N/no V/no D/no C/no acid reflux Musculoskeletal: no muscle aches/no joint aches Skin: no rashes, no hair loss Neurological: no tremors/no numbness/no tingling/no dizziness  I reviewed pt's medications, allergies, PMH, social hx, family hx, and changes were documented in the history of present illness. Otherwise, unchanged from my initial visit note.  Past Medical History:  Diagnosis Date  . Anemia   . Mild persistent asthma 01/24/2012  . Post-nasal drip 06/11/2012   Past Surgical History:  Procedure Laterality Date  . ABDOMINAL HYSTERECTOMY  04/15/2010  . CESAREAN SECTION  2004, 2011   Social History   Social History  . Marital status: Single    Spouse name: N/A  . Number of children: 2   Occupational  History  . Radiology tech extender, receptionist    Social History Main Topics  . Smoking status: Never Smoker  . Smokeless tobacco: Never Used  . Alcohol use No  . Drug use: No  . Sexual activity: Yes    Birth control/ protection: Surgical - TAH   Current Outpatient Medications on File Prior to Visit  Medication Sig Dispense Refill  . albuterol (PROAIR HFA) 108 (90 Base) MCG/ACT inhaler 2 puffs as needed    . fluticasone (FLOVENT HFA) 110 MCG/ACT inhaler 1 puff    . methimazole (TAPAZOLE) 5 MG tablet Take 1 tablet (5 mg total) by mouth  daily. 90 tablet 1  . Multiple Vitamin (MULTIVITAMIN) tablet Take 1 tablet by mouth daily.    . Omega-3 Fatty Acids (FISH OIL PO) Take by mouth.    . valACYclovir (VALTREX) 1000 MG tablet 2 tablets     No current facility-administered medications on file prior to visit.    Allergies  Allergen Reactions  . Amoxicillin     Other reaction(s): sick on stomach, vomiting  . Latex Itching    Itching and redness to affected site   Family History  Problem Relation Age of Onset  . Breast cancer Maternal Grandmother        Pt unsure of age of onset  . Asthma Neg Hx   . Diabetes Neg Hx   . Early death Neg Hx   . Heart disease Neg Hx   . Hyperlipidemia Neg Hx   . Hypertension Neg Hx   . Kidney disease Neg Hx   . Stroke Neg Hx    She has diabetes, HTN, HL in aunts and uncles, heart problems in aunts and cancer in grandmother aunts and uncles  PE: BP 128/60   Pulse 68   Ht 5\' 5"  (1.651 m) Comment: measured  Wt 258 lb (117 kg)   LMP 08/14/2010   SpO2 97%   BMI 42.93 kg/m  Wt Readings from Last 3 Encounters:  03/17/18 258 lb (117 kg)  04/16/17 252 lb 8 oz (114.5 kg)  03/17/17 252 lb 9.6 oz (114.6 kg)   Constitutional: overweight, in NAD Eyes: PERRLA, EOMI, no exophthalmos ENT: moist mucous membranes, + symmetric, smooth, thyromegaly, no cervical lymphadenopathy Cardiovascular: RRR, No MRG Respiratory: CTA B Gastrointestinal: abdomen soft, NT, ND, BS+ Musculoskeletal: no deformities, strength intact in all 4 Skin: moist, warm, no rashes Neurological: no tremor with outstretched hands, DTR normal in all 4  ASSESSMENT: 1. Graves ds.  PLAN:  1. Patient with symptoms, and elevated TSI antibodies.  She initially had weight loss of 30 pounds, but no other thyrotoxic symptoms.  We started methimazole and she started to feel much better.  At last visit she was asymptomatic on 5 mg of methimazole daily, a low dose.  We continued the dose after last visit and her TFTs remain normal.   Also, she has no side effects from methimazole.  -She is usually on biotin, but now off for >1 mo, so we can check a TSH, free T4, free T3.  At last visit, her TSI antibodies were still elevated.  We will repeat them today to check for Graves' disease activity. -We again discussed that she is responding very well to methimazole and we may be able to stop the medication soon.  At today's visit, if TSH is still normal, I will advised her to decrease the dose of methimazole to 2.5 mg daily and then plan to stop if possible.  However, we discussed  that newer studies show that a low-dose methimazole is safe and effective in preventing recurrences of Graves' disease. -she has some wt gain but she attributes this to eating more during Txgiving -No need to start a beta-blocker since she is not tremulous, tachycardic, or anxious. -I will see her back in 1 year.  We will have her back in 6 months for repeat labs.  - time spent with the patient: 15 min, of which >50% was spent in obtaining information about her symptoms, reviewing her previous labs, evaluations, and treatments, counseling her about her condition (please see the discussed topics above), and developing a plan to further investigate and treat it.  Component     Latest Ref Rng & Units 03/17/2018  Triiodothyronine,Free,Serum     2.3 - 4.2 pg/mL 3.8  T4,Free(Direct)     0.60 - 1.60 ng/dL 0.72  TSH     0.35 - 4.50 uIU/mL 2.50  TSI     <140 % baseline 100  Normal thyroid tests, including normalized Graves' antibodies.  We will decrease the dose of her methimazole to 2.5 mg daily and have her back in 2 months for TFT recheck.  Philemon Kingdom, MD PhD [facility]

## 2018-03-17 NOTE — Patient Instructions (Signed)
Please continue 5 mg of methimazole daily for now.  Please stop at the lab.  Please come back in 1 year, but in 6 months for labs.

## 2018-03-19 LAB — THYROID STIMULATING IMMUNOGLOBULIN: TSI: 100 % baseline (ref <140)

## 2018-03-20 MED ORDER — METHIMAZOLE 5 MG PO TABS: 2.5000 mg | ORAL_TABLET | Freq: Every day | ORAL | 1 refills | Status: DC

## 2018-04-03 DIAGNOSIS — Z6841 Body Mass Index (BMI) 40.0 and over, adult: Secondary | ICD-10-CM | POA: Diagnosis not present

## 2018-04-03 DIAGNOSIS — Z01419 Encounter for gynecological examination (general) (routine) without abnormal findings: Secondary | ICD-10-CM | POA: Diagnosis not present

## 2018-04-13 MED FILL — methIMAzole 5 MG TABS: 5 | 90 days supply | Qty: 90 | Fill #1

## 2018-05-19 ENCOUNTER — Other Ambulatory Visit: Payer: Self-pay | Admitting: Internal Medicine

## 2018-05-19 ENCOUNTER — Other Ambulatory Visit (INDEPENDENT_AMBULATORY_CARE_PROVIDER_SITE_OTHER): Payer: 59

## 2018-05-19 DIAGNOSIS — E0500 Thyrotoxicosis with diffuse goiter without thyrotoxic crisis or storm: Secondary | ICD-10-CM

## 2018-05-19 LAB — T3, FREE: T3, Free: 4 pg/mL (ref 2.3–4.2)

## 2018-05-19 LAB — T4, FREE: Free T4: 0.84 ng/dL (ref 0.60–1.60)

## 2018-05-19 LAB — TSH: TSH: 2.32 u[IU]/mL (ref 0.35–4.50)

## 2018-06-22 MED FILL — valACYclovir HCL 1 GM TABS: 1 | 30 days supply | Qty: 15 | Fill #1

## 2018-08-05 MED FILL — FLUCONAZOLE 150 MG TABS: 150 | 1 days supply | Qty: 1 | Fill #0

## 2018-08-17 ENCOUNTER — Other Ambulatory Visit: Payer: 59

## 2018-08-18 ENCOUNTER — Other Ambulatory Visit: Payer: 59

## 2018-08-27 ENCOUNTER — Encounter: Payer: Self-pay | Admitting: Internal Medicine

## 2018-08-28 ENCOUNTER — Other Ambulatory Visit: Payer: Self-pay | Admitting: Internal Medicine

## 2018-08-28 MED ORDER — METHIMAZOLE 5 MG PO TABS: 2.5000 mg | ORAL_TABLET | Freq: Every day | ORAL | 1 refills | Status: DC

## 2018-08-29 MED FILL — methIMAzole 5 MG TABS: 5 | 90 days supply | Qty: 45 | Fill #0

## 2018-08-31 ENCOUNTER — Telehealth: Payer: Self-pay | Admitting: Internal Medicine

## 2018-08-31 NOTE — Telephone Encounter (Signed)
Please transfer patients Rx refill methimazole (TAPAZOLE) 5 MG tablet to [facility], due to [facility] being closed.  Thanks

## 2018-08-31 NOTE — Telephone Encounter (Signed)
This has been done.

## 2018-09-16 ENCOUNTER — Other Ambulatory Visit: Payer: Self-pay

## 2018-09-16 ENCOUNTER — Other Ambulatory Visit: Payer: Self-pay | Admitting: Internal Medicine

## 2018-09-16 ENCOUNTER — Other Ambulatory Visit (INDEPENDENT_AMBULATORY_CARE_PROVIDER_SITE_OTHER): Payer: 59

## 2018-09-16 ENCOUNTER — Other Ambulatory Visit: Payer: 59

## 2018-09-16 DIAGNOSIS — E0500 Thyrotoxicosis with diffuse goiter without thyrotoxic crisis or storm: Secondary | ICD-10-CM

## 2018-09-16 LAB — TSH: TSH: 2.01 u[IU]/mL (ref 0.35–4.50)

## 2018-09-16 LAB — T3, FREE: T3, Free: 3.9 pg/mL (ref 2.3–4.2)

## 2018-09-16 LAB — T4, FREE: Free T4: 0.93 ng/dL (ref 0.60–1.60)

## 2018-10-22 ENCOUNTER — Other Ambulatory Visit: Payer: Self-pay

## 2018-10-22 ENCOUNTER — Encounter: Payer: Self-pay | Admitting: Internal Medicine

## 2018-10-22 ENCOUNTER — Other Ambulatory Visit (INDEPENDENT_AMBULATORY_CARE_PROVIDER_SITE_OTHER): Payer: 59

## 2018-10-22 DIAGNOSIS — E0500 Thyrotoxicosis with diffuse goiter without thyrotoxic crisis or storm: Secondary | ICD-10-CM | POA: Diagnosis not present

## 2018-10-22 LAB — T4, FREE: Free T4: 0.74 ng/dL (ref 0.60–1.60)

## 2018-10-22 LAB — T3, FREE: T3, Free: 3.6 pg/mL (ref 2.3–4.2)

## 2018-10-22 LAB — TSH: TSH: 1.07 u[IU]/mL (ref 0.35–4.50)

## 2018-11-03 DIAGNOSIS — E0590 Thyrotoxicosis, unspecified without thyrotoxic crisis or storm: Secondary | ICD-10-CM | POA: Diagnosis not present

## 2018-11-03 DIAGNOSIS — Z0000 Encounter for general adult medical examination without abnormal findings: Secondary | ICD-10-CM | POA: Diagnosis not present

## 2018-11-03 DIAGNOSIS — J4530 Mild persistent asthma, uncomplicated: Secondary | ICD-10-CM | POA: Diagnosis not present

## 2018-11-13 DIAGNOSIS — N760 Acute vaginitis: Secondary | ICD-10-CM | POA: Diagnosis not present

## 2018-11-13 MED FILL — BETAMETHASONE VALERATE 0.1: 0.1 | 15 days supply | Qty: 15 | Fill #0

## 2018-11-13 MED FILL — FLUCONAZOLE 150 MG TABS: 150 | 1 days supply | Qty: 1 | Fill #0

## 2019-01-02 IMAGING — MG DIGITAL SCREENING BILATERAL MAMMOGRAM WITH CAD
3 series · 3 of 3 positions shown · non-contrast
Comparison: Previous exam(s).

CLINICAL DATA: Screening.

EXAM:
DIGITAL SCREENING BILATERAL MAMMOGRAM WITH CAD

[R MLO]
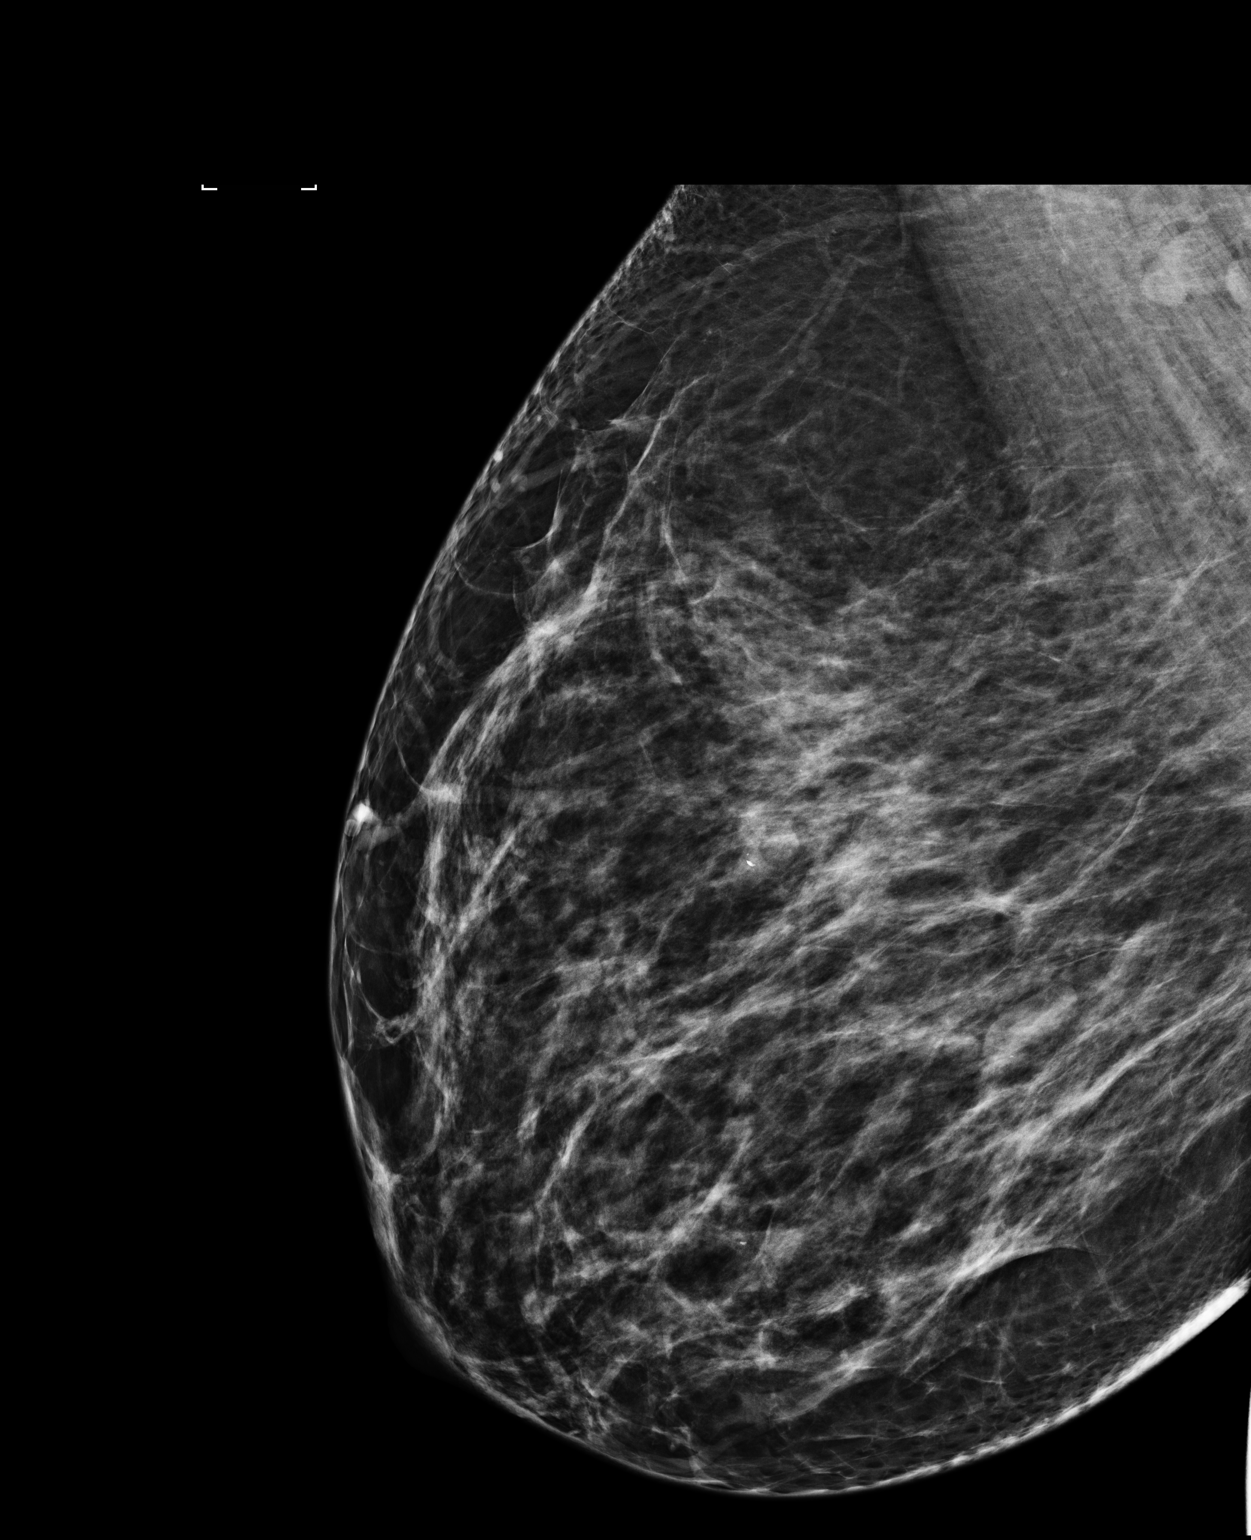

[R CC]
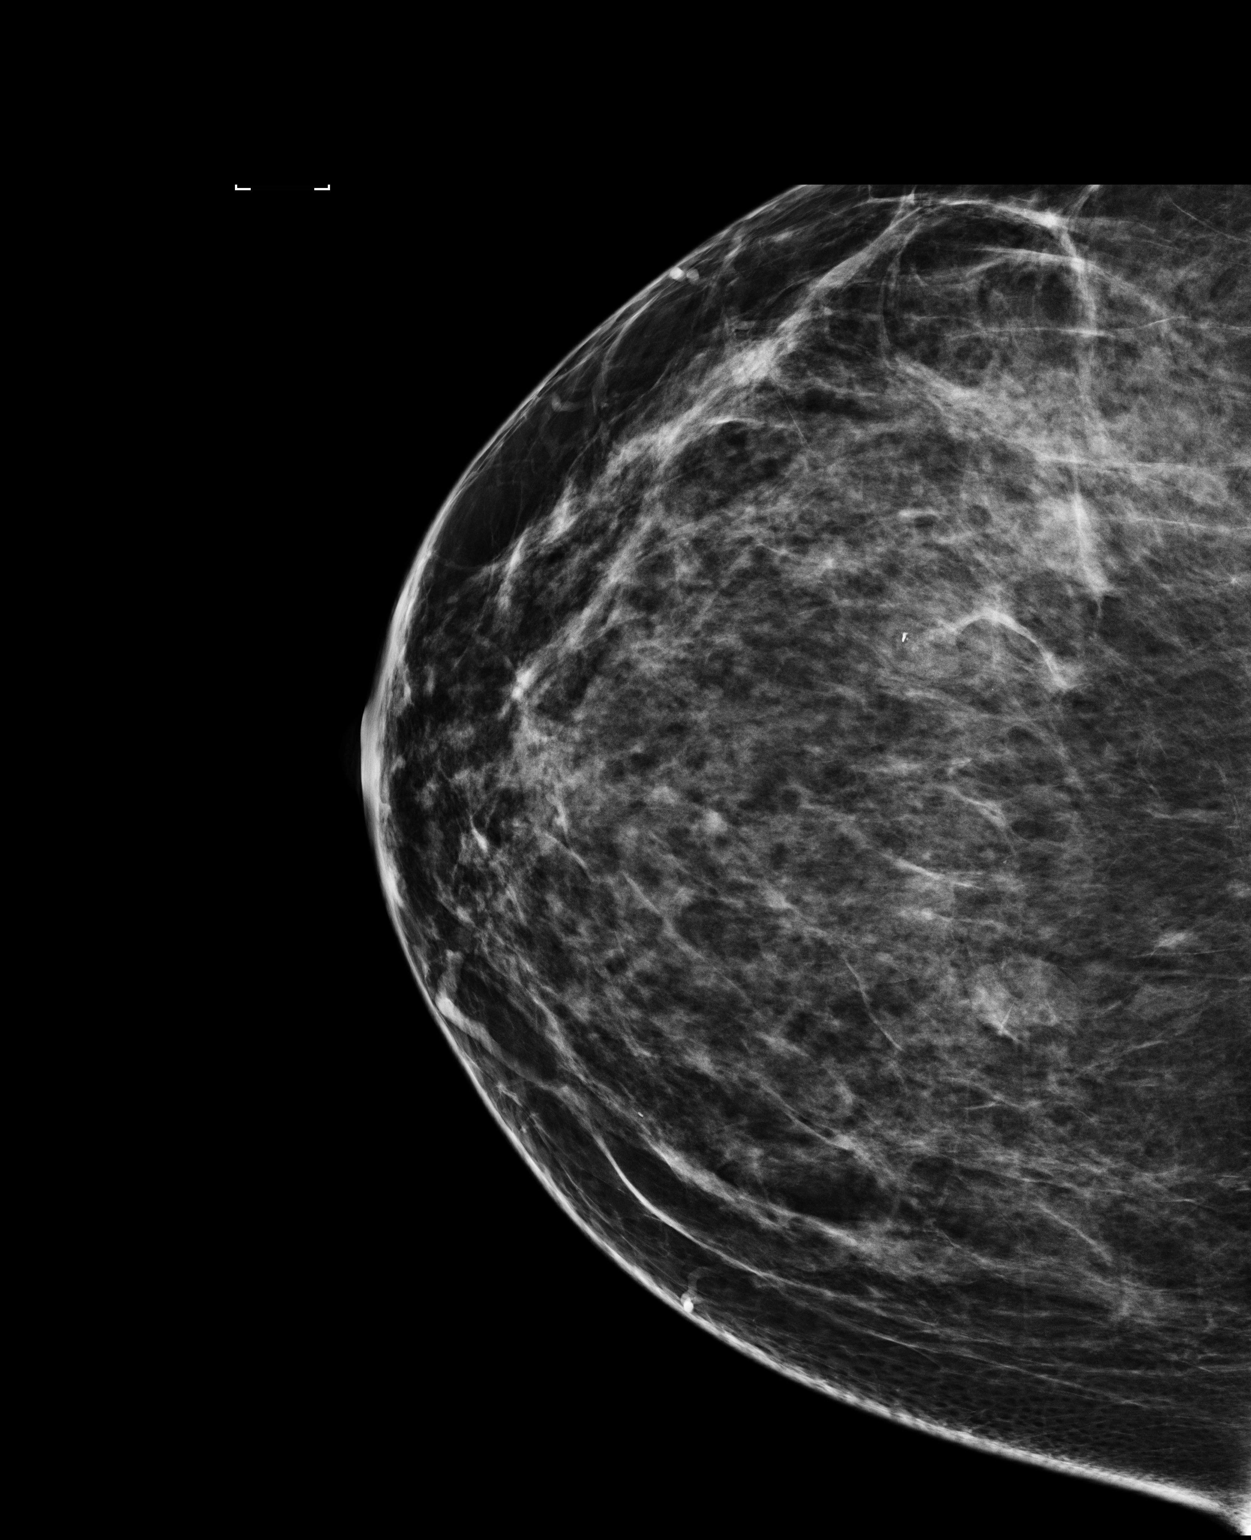

[L CC]
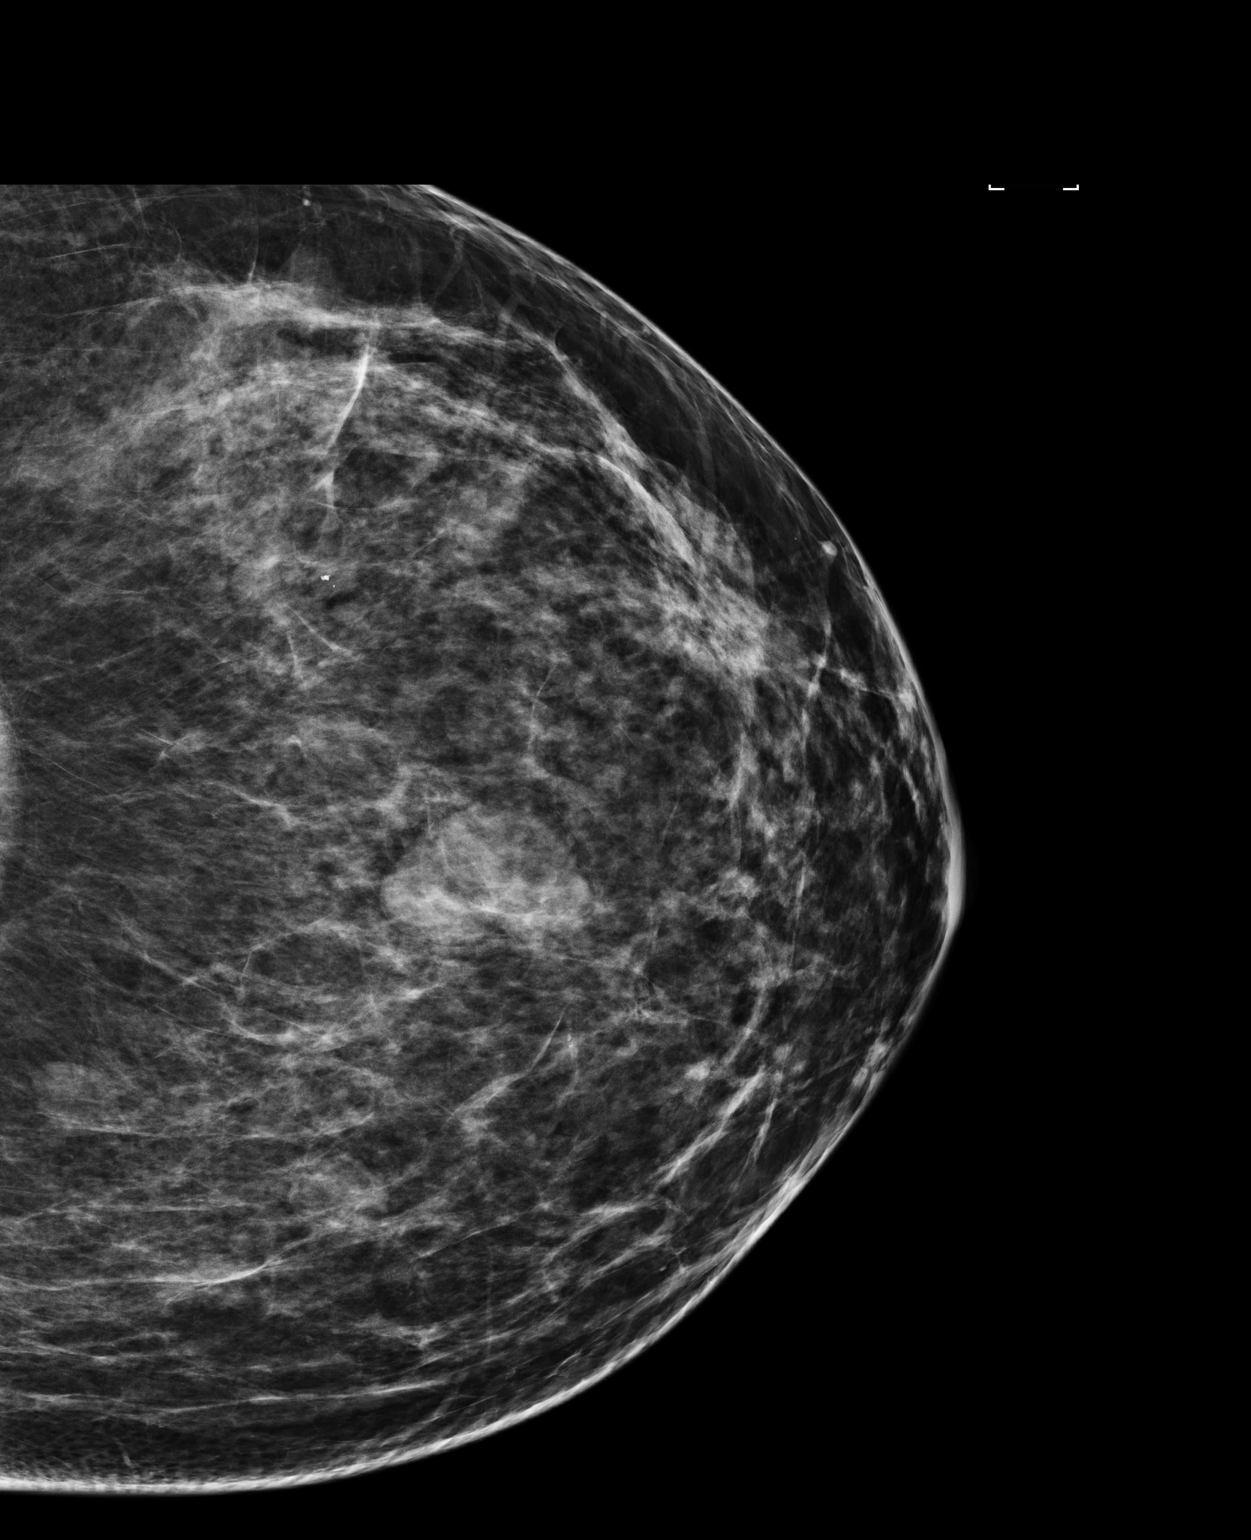

[3 of 3 positions shown; findings below may reference images not displayed]

ACR Breast Density Category c: The breast tissue is heterogeneously
dense, which may obscure small masses.
FINDINGS: In the left breast, a possible mass warrants further evaluation. In
the right breast, no findings suspicious for malignancy.

Images were processed with CAD.
IMPRESSION: Further evaluation is suggested for possible mass in the left
breast.

RECOMMENDATION:
Diagnostic mammogram and possibly ultrasound of the left breast.
(Code:[id number])

The patient will be contacted regarding the findings, and additional
imaging will be scheduled.

BI-RADS CATEGORY  0: Incomplete. Need additional imaging evaluation
and/or prior mammograms for comparison.

## 2019-01-14 MED FILL — valACYclovir HCL 1 GM TABS: 1 | 30 days supply | Qty: 15 | Fill #2

## 2019-03-18 ENCOUNTER — Other Ambulatory Visit: Payer: Self-pay

## 2019-03-18 ENCOUNTER — Encounter: Payer: Self-pay | Admitting: Internal Medicine

## 2019-03-18 ENCOUNTER — Ambulatory Visit (INDEPENDENT_AMBULATORY_CARE_PROVIDER_SITE_OTHER): Payer: 59 | Admitting: Internal Medicine

## 2019-03-18 VITALS — BP 120/82 | HR 72 | Ht 65.0 in | Wt 258.0 lb

## 2019-03-18 DIAGNOSIS — E0500 Thyrotoxicosis with diffuse goiter without thyrotoxic crisis or storm: Secondary | ICD-10-CM

## 2019-03-18 LAB — TSH: TSH: 1.45 u[IU]/mL (ref 0.35–4.50)

## 2019-03-18 LAB — T3, FREE: T3, Free: 3.5 pg/mL (ref 2.3–4.2)

## 2019-03-18 LAB — T4, FREE: Free T4: 0.93 ng/dL (ref 0.60–1.60)

## 2019-03-18 NOTE — Patient Instructions (Signed)
Please continue off methimazole.  Please stop at the lab.  Please come back for a follow-up appointment in 1 year. 

## 2019-03-18 NOTE — Progress Notes (Signed)
Patient ID: Tina Eaton, female   DOB: Jan 04, 1981, 38 y.o.   MRN: [id number]   This visit occurred during the SARS-CoV-2 public health emergency.  Safety protocols were in place, including screening questions prior to the visit, additional usage of staff PPE, and extensive cleaning of exam room while observing appropriate contact time as indicated for disinfecting solutions.   HPI  Tina Eaton is a 38 y.o.-year-old female, initially referred by her PCP, Dr. Kathyrn Lass, now returning for management of Graves ds. Last visit 1 year ago.  Reviewed and addended history: She started to have pbs breathing - worse summer 2017. She saw her PCP who checked several labs including a TSH. This returned abnormal. The other thyroid hormones and the thyroid antibodies were added to the blood in the lab, and they were also abnormal, indicating thyrotoxicosis.  Based on the disease course and her elevated TSI antibodies, we diagnosed Graves' disease and did not do a thyroid uptake and scan.  We started her on methimazole 5 mg twice a day in 02/2016 and she started to feel much better after this. She did not gain weight on the medication, though. Of note, she initially lost 30 pounds in 1.5 years due to her Graves' disease.  We decreased the methimazole dose to 5 mg daily in 05/2016, then to 2.5 mg daily in 03/2018 and then stopped in 09/2018.  She continues now off methimazole.  Reviewed patient's TFTs: Lab Results  Component Value Date   TSH 1.07 10/22/2018   TSH 2.01 09/16/2018   TSH 2.32 05/19/2018   TSH 2.50 03/17/2018   TSH 1.53 09/16/2017   TSH 1.87 03/17/2017   TSH 0.49 09/16/2016   TSH 0.28 (L) 07/30/2016   TSH 0.10 (L) 06/26/2016   TSH 0.22 (L) 05/22/2016   FREET4 0.74 10/22/2018   FREET4 0.93 09/16/2018   FREET4 0.84 05/19/2018   FREET4 0.72 03/17/2018   FREET4 0.87 09/16/2017   FREET4 0.83 03/17/2017   FREET4 0.78 09/16/2016   FREET4 0.68 07/30/2016   FREET4 0.82 06/26/2016    FREET4 0.59 (L) 05/22/2016  01/31/2016: TSH <0.01, free T4 2.09 (0.61-1.12), total T3 395 (71-180), TSI 310 (0-139%)   TSI antibodies normalized at last check: Lab Results  Component Value Date   TSI 100 03/17/2018   TSI 190 (H) 03/17/2017    She was previously on biotin, but stopped before last visit.  Pt denies: - weight loss - heat intolerance - tremors - palpitations - anxiety - hyperdefecation - hair loss  Pt denies: - feeling nodules in neck - hoarseness - dysphagia - choking - SOB with lying down  Pt does not have a FH of thyroid ds.No FH of thyroid cancer. No h/o radiation tx to head or neck.  No seaweed or kelp. No recent contrast studies. No herbal supplements. No Biotin use. No recent steroids use.   She had a TAH in 2012.  ROS: Constitutional: no weight gain/no weight loss, no fatigue, no subjective hyperthermia, no subjective hypothermia Eyes: no blurry vision, no xerophthalmia ENT: no sore throat,  + see HPI Cardiovascular: no CP/no SOB/no palpitations/no leg swelling Respiratory: no cough/no SOB/no wheezing Gastrointestinal: no N/no V/no D/no C/no acid reflux Musculoskeletal: no muscle aches/no joint aches Skin: no rashes, no hair loss Neurological: no tremors/no numbness/no tingling/no dizziness  I reviewed pt's medications, allergies, PMH, social hx, family hx, and changes were documented in the history of present illness. Otherwise, unchanged from my initial visit note.  Past Medical  History:  Diagnosis Date  . Anemia   . Mild persistent asthma 01/24/2012  . Post-nasal drip 06/11/2012   Past Surgical History:  Procedure Laterality Date  . ABDOMINAL HYSTERECTOMY  04/15/2010  . CESAREAN SECTION  2004, 2011   Social History   Social History  . Marital status: Single    Spouse name: N/A  . Number of children: 2   Occupational History  . Radiology tech extender, receptionist    Social History Main Topics  . Smoking status: Never Smoker   . Smokeless tobacco: Never Used  . Alcohol use No  . Drug use: No  . Sexual activity: Yes    Birth control/ protection: Surgical - TAH   Current Outpatient Medications on File Prior to Visit  Medication Sig Dispense Refill  . albuterol (PROAIR HFA) 108 (90 Base) MCG/ACT inhaler 2 puffs as needed    . fluticasone (FLOVENT HFA) 110 MCG/ACT inhaler 1 puff    . Multiple Vitamin (MULTIVITAMIN) tablet Take 1 tablet by mouth daily.    . Omega-3 Fatty Acids (FISH OIL PO) Take by mouth.    . valACYclovir (VALTREX) 1000 MG tablet 2 tablets     No current facility-administered medications on file prior to visit.    Allergies  Allergen Reactions  . Amoxicillin     Other reaction(s): sick on stomach, vomiting  . Latex Itching    Itching and redness to affected site   Family History  Problem Relation Age of Onset  . Breast cancer Maternal Grandmother        Pt unsure of age of onset  . Asthma Neg Hx   . Diabetes Neg Hx   . Early death Neg Hx   . Heart disease Neg Hx   . Hyperlipidemia Neg Hx   . Hypertension Neg Hx   . Kidney disease Neg Hx   . Stroke Neg Hx    She has diabetes, HTN, HL in aunts and uncles, heart problems in aunts and cancer in grandmother aunts and uncles  PE: BP 120/82   Pulse 72   Ht 5\' 5"  (1.651 m)   Wt 258 lb (117 kg)   LMP 08/14/2010   BMI 42.93 kg/m  Wt Readings from Last 3 Encounters:  03/18/19 258 lb (117 kg)  03/17/18 258 lb (117 kg)  04/16/17 252 lb 8 oz (114.5 kg)   Constitutional: overweight, in NAD Eyes: PERRLA, EOMI, no exophthalmos ENT: moist mucous membranes, + symmetric, smooth thyromegaly, no cervical lymphadenopathy Cardiovascular: RRR, No MRG Respiratory: CTA B Gastrointestinal: abdomen soft, NT, ND, BS+ Musculoskeletal: no deformities, strength intact in all 4 Skin: moist, warm, no rashes Neurological: no tremor with outstretched hands, DTR normal in all 4  ASSESSMENT: 1. Graves ds.  PLAN:  1. Patient with history of Graves'  disease, with initial weight loss of approximately 30 pounds, but no other thyrotoxic symptoms.  We started methimazole and her test improved so we could subsequently decrease the doses and finally stop in 09/2018.  TFTs obtained a month later were normal.  She continues off methimazole now. -She denies thyrotoxic symptoms: Weight loss, palpitations, tremors, anxiety, heat intolerance, hyper defecation -At this visit, we will check her TFTs and see if she would continue off methimazole.  We again discussed about other modalities for treatment in case this recurs, including cardiac treatment, and, last resort, surgery. However, if her TFTs are abnormal, will restart MMI, which, at a low dose, has been shown to be safe and effective in preventing  Graves recurrences. -No need to start a beta-blocker for now since she is not tremulous, tachycardic -We will have her back for another visit in a year, but I advised him to let me know if she develops thyrotoxic symptoms, in which case, check her labs sooner.  - time spent with the patient: 15 minutes, of which >50% was spent in obtaining information about her symptoms, reviewing her previous labs, evaluations, and treatments, counseling her about her condition (please see the discussed topics above), and developing a plan to further investigate and treat it; she had a number of questions which I addressed.  Component     Latest Ref Rng & Units 03/18/2019  Triiodothyronine,Free,Serum     2.3 - 4.2 pg/mL 3.5  T4,Free(Direct)     0.60 - 1.60 ng/dL 0.93  TSH     0.35 - 4.50 uIU/mL 1.45  Thyroid tests are normal.  We do not need to restart methimazole for now. Plan to repeat the test in 6 months. Philemon Kingdom, MD PhD [facility]

## 2019-06-07 DIAGNOSIS — Z01419 Encounter for gynecological examination (general) (routine) without abnormal findings: Secondary | ICD-10-CM | POA: Diagnosis not present

## 2019-06-07 DIAGNOSIS — Z6841 Body Mass Index (BMI) 40.0 and over, adult: Secondary | ICD-10-CM | POA: Diagnosis not present

## 2019-06-07 DIAGNOSIS — R1900 Intra-abdominal and pelvic swelling, mass and lump, unspecified site: Secondary | ICD-10-CM | POA: Diagnosis not present

## 2019-11-22 ENCOUNTER — Other Ambulatory Visit (HOSPITAL_COMMUNITY): Payer: Self-pay | Admitting: Obstetrics and Gynecology

## 2019-11-22 MED FILL — metroNIDAZOLE 500 MG TABS: 500 | 7 days supply | Qty: 14 | Fill #0

## 2019-11-22 MED FILL — FLUCONAZOLE 150 MG TABS: 150 | 2 days supply | Qty: 2 | Fill #0

## 2020-01-13 ENCOUNTER — Other Ambulatory Visit (HOSPITAL_COMMUNITY): Payer: Self-pay | Admitting: Radiology

## 2020-01-13 DIAGNOSIS — N760 Acute vaginitis: Secondary | ICD-10-CM | POA: Diagnosis not present

## 2020-01-13 MED FILL — FLUCONAZOLE 150 MG TABS: 150 | 2 days supply | Qty: 2 | Fill #0

## 2020-01-14 ENCOUNTER — Other Ambulatory Visit: Payer: 59

## 2020-01-31 DIAGNOSIS — Z0000 Encounter for general adult medical examination without abnormal findings: Secondary | ICD-10-CM | POA: Diagnosis not present

## 2020-01-31 DIAGNOSIS — Z131 Encounter for screening for diabetes mellitus: Secondary | ICD-10-CM | POA: Diagnosis not present

## 2020-01-31 DIAGNOSIS — Z13220 Encounter for screening for lipoid disorders: Secondary | ICD-10-CM | POA: Diagnosis not present

## 2020-01-31 DIAGNOSIS — E0590 Thyrotoxicosis, unspecified without thyrotoxic crisis or storm: Secondary | ICD-10-CM | POA: Diagnosis not present

## 2020-02-10 MED FILL — FLUCONAZOLE 150 MG TABS: 150 | 2 days supply | Qty: 2 | Fill #1

## 2020-03-17 ENCOUNTER — Other Ambulatory Visit: Payer: Self-pay

## 2020-03-17 ENCOUNTER — Ambulatory Visit (INDEPENDENT_AMBULATORY_CARE_PROVIDER_SITE_OTHER): Payer: 59 | Admitting: Internal Medicine

## 2020-03-17 ENCOUNTER — Encounter: Payer: Self-pay | Admitting: Internal Medicine

## 2020-03-17 VITALS — BP 122/74 | HR 80 | Ht 65.0 in | Wt 265.4 lb

## 2020-03-17 DIAGNOSIS — E0500 Thyrotoxicosis with diffuse goiter without thyrotoxic crisis or storm: Secondary | ICD-10-CM

## 2020-03-17 LAB — T4, FREE: Free T4: 0.93 ng/dL (ref 0.60–1.60)

## 2020-03-17 LAB — TSH: TSH: 1.01 u[IU]/mL (ref 0.35–4.50)

## 2020-03-17 LAB — T3, FREE: T3, Free: 4 pg/mL (ref 2.3–4.2)

## 2020-03-17 NOTE — Progress Notes (Signed)
Patient ID: Tina Eaton, female   DOB: 11-Aug-1980, 39 y.o.   MRN: [id number]   This visit occurred during the SARS-CoV-2 public health emergency.  Safety protocols were in place, including screening questions prior to the visit, additional usage of staff PPE, and extensive cleaning of exam room while observing appropriate contact time as indicated for disinfecting solutions.   HPI  Tina Eaton is a 39 y.o.-year-old female, initially referred by her PCP, Dr. Kathyrn Lass, returning for follow-up for Graves ds. Last visit 1 year ago.  Reviewed and addended history: She started to have pbs breathing - worse summer 2017. She saw her PCP who checked several labs including a TSH. This returned abnormal. The other thyroid hormones and the thyroid antibodies were added to the blood in the lab, and they were also abnormal, indicating thyrotoxicosis.  In retrospect, she had 30 pound loss in 1.5 years before diagnosis.  Based on the disease course and her elevated TSI antibodies, we diagnosed Graves' disease.  Therefore, we did not check a thyroid uptake and scan.  02/2016: Started methimazole 5 mg twice a day 05/2016: Methimazole decreased to 5 mg daily 03/2018: Methimazole increased to 2.5 mg daily 09/2018: Methimazole stopped  She continues off methimazole.  Reviewed her TFTs: Lab Results  Component Value Date   TSH 1.45 03/18/2019   TSH 1.07 10/22/2018   TSH 2.01 09/16/2018   TSH 2.32 05/19/2018   TSH 2.50 03/17/2018   TSH 1.53 09/16/2017   TSH 1.87 03/17/2017   TSH 0.49 09/16/2016   TSH 0.28 (L) 07/30/2016   TSH 0.10 (L) 06/26/2016   FREET4 0.93 03/18/2019   FREET4 0.74 10/22/2018   FREET4 0.93 09/16/2018   FREET4 0.84 05/19/2018   FREET4 0.72 03/17/2018   FREET4 0.87 09/16/2017   FREET4 0.83 03/17/2017   FREET4 0.78 09/16/2016   FREET4 0.68 07/30/2016   FREET4 0.82 06/26/2016  01/31/2016: TSH <0.01, free T4 2.09 (0.61-1.12), total T3 395 (71-180), TSI 310 (0-139%)   TSI  antibodies were high in the past but normalized: Lab Results  Component Value Date   TSI 100 03/17/2018   TSI 190 (H) 03/17/2017    She was previously on biotin but she is now off.  Pt denies: - weight loss - heat intolerance - tremors - palpitations - anxiety - hyperdefecation - hair loss  Pt denies: - feeling nodules in neck - hoarseness - dysphagia - choking - SOB with lying down  No FH of thyroid disease or thyroid cancer. No h/o radiation tx to head or neck.  No seaweed or kelp. No recent contrast studies. No herbal supplements. No Biotin use. No recent steroids use.   She had a TAH in 2012.  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  ROS: Constitutional: + weight gain/no weight loss, no fatigue, no subjective hyperthermia, no subjective hypothermia Eyes: no blurry vision, no xerophthalmia ENT: no sore throat, + see HPI Cardiovascular: no CP/no SOB/no palpitations/no leg swelling Respiratory: no cough/no SOB/no wheezing Gastrointestinal: no N/no V/no D/no C/no acid reflux Musculoskeletal: no muscle aches/no joint aches Skin: no rashes, no hair loss Neurological: no tremors/no numbness/no tingling/no dizziness  I reviewed pt's medications, allergies, PMH, social hx, family hx, and changes were documented in the history of present illness. Otherwise, unchanged from my initial visit note.  Past Medical History:  Diagnosis Date  . Anemia   . Mild persistent asthma 01/24/2012  . Post-nasal drip 06/11/2012   Past Surgical History:  Procedure Laterality Date  .  ABDOMINAL HYSTERECTOMY  04/15/2010  . CESAREAN SECTION  2004, 2011   Social History   Social History  . Marital status: Single    Spouse name: N/A  . Number of children: 2   Occupational History  . Radiology tech extender, receptionist    Social History Main Topics  . Smoking status: Never Smoker  . Smokeless tobacco: Never Used  . Alcohol use No  . Drug use: No  . Sexual activity: Yes     Birth control/ protection: Surgical - TAH   Current Outpatient Medications on File Prior to Visit  Medication Sig Dispense Refill  . albuterol (PROAIR HFA) 108 (90 Base) MCG/ACT inhaler 2 puffs as needed    . fluticasone (FLOVENT HFA) 110 MCG/ACT inhaler 1 puff    . Multiple Vitamin (MULTIVITAMIN) tablet Take 1 tablet by mouth daily.    . Omega-3 Fatty Acids (FISH OIL PO) Take by mouth.    . valACYclovir (VALTREX) 1000 MG tablet 2 tablets     No current facility-administered medications on file prior to visit.   Allergies  Allergen Reactions  . Amoxicillin     Other reaction(s): sick on stomach, vomiting  . Latex Itching    Itching and redness to affected site   Family History  Problem Relation Age of Onset  . Breast cancer Maternal Grandmother        Pt unsure of age of onset  . Asthma Neg Hx   . Diabetes Neg Hx   . Early death Neg Hx   . Heart disease Neg Hx   . Hyperlipidemia Neg Hx   . Hypertension Neg Hx   . Kidney disease Neg Hx   . Stroke Neg Hx    She has diabetes, HTN, HL in aunts and uncles, heart problems in aunts and cancer in grandmother aunts and uncles  PE: BP 122/74   Pulse 80   Ht 5\' 5"  (1.651 m)   Wt 265 lb 6.4 oz (120.4 kg)   LMP 08/14/2010   SpO2 96%   BMI 44.16 kg/m  Wt Readings from Last 3 Encounters:  03/17/20 265 lb 6.4 oz (120.4 kg)  03/18/19 258 lb (117 kg)  03/17/18 258 lb (117 kg)   Constitutional: overweight, in NAD Eyes: PERRLA, EOMI, no exophthalmos ENT: moist mucous membranes, + symmetric, smooth, thyromegaly, no cervical lymphadenopathy Cardiovascular: RRR, No MRG Respiratory: CTA B Gastrointestinal: abdomen soft, NT, ND, BS+ Musculoskeletal: no deformities, strength intact in all 4 Skin: moist, warm, no rashes Neurological: no tremor with outstretched hands, DTR normal in all 4  ASSESSMENT: 1. Graves ds.  PLAN:  1. Patient with history of thyrotoxicosis (low TSH, high free thyroid hormones), with thyrotoxic symptoms:  weight loss of approximately 30 pounds initially, but no other thyrotoxic symptoms. TSI antibodies were elevated, pointing towards a diagnosis of Graves' disease.  We started methimazole treatment.  We were able to decrease the dose of methimazole gradually and finally stopped in 09/2018. -She continues off methimazole now -We did discuss about definitive treatment Graves' disease to include RAI treatment ablation or surgery, however, her Graves' disease appears to be in remission even off methimazole for now. -TFTs were normal a year ago -At today's visit, she does not complain of thyrotoxic symptoms: No weight loss, palpitations, tremors, anxiety, heat intolerance, hyper defecation.  She did have a 7 pound weight, which she attributes to working from home.  She is not sure whether she would be tempted working towards -Also, she does not have any  signs of Graves' ophthalmopathy: double vision, blurry vision, eye pain, chemosis -We will recheck TFTs today and see if we need to restart methimazole -No beta-blocker required since she is not tremulous, tachycardic -I will see her back in 1 year but I advised her to let me know if she develops thyrotoxic symptoms, in which case, we would need to check her labs sooner   Component     Latest Ref Rng & Units 03/17/2020  Triiodothyronine,Free,Serum     2.3 - 4.2 pg/mL 4.0  T4,Free(Direct)     0.60 - 1.60 ng/dL 0.93  TSH     0.35 - 4.50 uIU/mL 1.01   Thyroid tests remain normal off methimazole.  Philemon Kingdom, MD PhD [facility] Endocrinology

## 2020-03-17 NOTE — Patient Instructions (Signed)
Please continue off methimazole.  Please stop at the lab.  Please come back for a follow-up appointment in 1 year. 

## 2020-05-04 ENCOUNTER — Other Ambulatory Visit: Payer: 59

## 2020-05-04 DIAGNOSIS — Z20822 Contact with and (suspected) exposure to covid-19: Secondary | ICD-10-CM | POA: Diagnosis not present

## 2020-05-04 MED FILL — FLUCONAZOLE 150 MG TABS: 150 | 2 days supply | Qty: 2 | Fill #1

## 2020-05-06 LAB — NOVEL CORONAVIRUS, NAA: SARS-CoV-2, NAA: NOT DETECTED

## 2020-05-06 LAB — SARS-COV-2, NAA 2 DAY TAT

## 2020-06-12 ENCOUNTER — Other Ambulatory Visit (HOSPITAL_COMMUNITY): Payer: Self-pay | Admitting: Obstetrics and Gynecology

## 2020-06-12 DIAGNOSIS — Z1231 Encounter for screening mammogram for malignant neoplasm of breast: Secondary | ICD-10-CM | POA: Diagnosis not present

## 2020-06-12 DIAGNOSIS — Z01419 Encounter for gynecological examination (general) (routine) without abnormal findings: Secondary | ICD-10-CM | POA: Diagnosis not present

## 2020-06-12 DIAGNOSIS — Z6841 Body Mass Index (BMI) 40.0 and over, adult: Secondary | ICD-10-CM | POA: Diagnosis not present

## 2020-06-12 MED FILL — VALACYCLOVIR HCL 500 MG TAB: 500 | 15 days supply | Qty: 30 | Fill #0

## 2020-09-04 ENCOUNTER — Other Ambulatory Visit: Payer: 59

## 2020-10-13 ENCOUNTER — Other Ambulatory Visit (HOSPITAL_COMMUNITY): Payer: Self-pay

## 2020-10-13 MED ORDER — FLUCONAZOLE 150 MG PO TABS
150.0000 mg | ORAL_TABLET | Freq: Every day | ORAL | 1 refills | Status: AC
  Filled 2020-10-13: qty 2, 2d supply, fill #0

## 2020-12-18 MED FILL — Valacyclovir HCl Tab 500 MG: ORAL | 15 days supply | Qty: 30 | Fill #0 | Status: AC

## 2020-12-19 ENCOUNTER — Other Ambulatory Visit (HOSPITAL_COMMUNITY): Payer: Self-pay

## 2021-01-26 DIAGNOSIS — M25531 Pain in right wrist: Secondary | ICD-10-CM | POA: Diagnosis not present

## 2021-02-05 DIAGNOSIS — Z0000 Encounter for general adult medical examination without abnormal findings: Secondary | ICD-10-CM | POA: Diagnosis not present

## 2021-02-05 DIAGNOSIS — R030 Elevated blood-pressure reading, without diagnosis of hypertension: Secondary | ICD-10-CM | POA: Diagnosis not present

## 2021-02-05 DIAGNOSIS — M25531 Pain in right wrist: Secondary | ICD-10-CM | POA: Diagnosis not present

## 2021-02-05 DIAGNOSIS — Z13220 Encounter for screening for lipoid disorders: Secondary | ICD-10-CM | POA: Diagnosis not present

## 2021-03-23 ENCOUNTER — Ambulatory Visit: Payer: 59 | Admitting: Internal Medicine

## 2021-04-20 ENCOUNTER — Ambulatory Visit: Payer: 59 | Admitting: Internal Medicine

## 2021-05-24 ENCOUNTER — Other Ambulatory Visit (HOSPITAL_COMMUNITY): Payer: Self-pay

## 2021-05-24 MED FILL — Valacyclovir HCl Tab 500 MG: ORAL | 15 days supply | Qty: 30 | Fill #1 | Status: AC

## 2021-06-12 ENCOUNTER — Ambulatory Visit: Payer: 59 | Admitting: Internal Medicine

## 2021-06-12 ENCOUNTER — Other Ambulatory Visit: Payer: Self-pay

## 2021-06-12 ENCOUNTER — Encounter: Payer: Self-pay | Admitting: Internal Medicine

## 2021-06-12 VITALS — BP 130/82 | HR 70 | Ht 65.0 in | Wt 263.6 lb

## 2021-06-12 DIAGNOSIS — E0500 Thyrotoxicosis with diffuse goiter without thyrotoxic crisis or storm: Secondary | ICD-10-CM

## 2021-06-12 LAB — T4, FREE: Free T4: 1.03 ng/dL (ref 0.60–1.60)

## 2021-06-12 LAB — T3, FREE: T3, Free: 4 pg/mL (ref 2.3–4.2)

## 2021-06-12 LAB — TSH: TSH: 1.31 u[IU]/mL (ref 0.35–5.50)

## 2021-06-12 NOTE — Progress Notes (Signed)
Patient ID: Tina Eaton, female   DOB: [birth year], 41 y.o.   MRN: [id number]   This visit occurred during the SARS-CoV-2 public health emergency.  Safety protocols were in place, including screening questions prior to the visit, additional usage of staff PPE, and extensive cleaning of exam room while observing appropriate contact time as indicated for disinfecting solutions.   HPI  Tina Eaton is a 41 y.o.-year-old female, initially referred by her PCP, Dr. Kathyrn Lass, returning for follow-up for Graves ds. Last visit 1 year ago.  Interim history: At this visit, she denies weight loss, heat intolerance, tremors, palpitations, anxiety. She had a steroid inj. And then a taper 3-4 mo ago for tendonitis, no recent steroids. Since last visit, she moved to [place], [place], 3 hours away.  She would like to continue to come to see me.  She mentions that she can also come back here for labs.  Reviewed history: She started to have pbs breathing - worse summer 2017. She saw her PCP who checked several labs including a TSH. This returned abnormal. The other thyroid hormones and the thyroid antibodies were added to the blood in the lab, and they were also abnormal, indicating thyrotoxicosis.  In retrospect, she had 30 pound loss in 1.5 years before diagnosis.  Based on the disease course and her elevated TSI antibodies, we diagnosed Graves' disease.  Therefore, we did not check a thyroid uptake and scan.  02/2016: Started methimazole 5 mg twice a day 05/2016: Methimazole decreased to 5 mg daily 03/2018: Methimazole increased to 2.5 mg daily 09/2018: Methimazole stopped  She continues off methimazole.  Reviewed her TFTs: Lab Results  Component Value Date   TSH 1.01 03/17/2020   TSH 1.45 03/18/2019   TSH 1.07 10/22/2018   TSH 2.01 09/16/2018   TSH 2.32 05/19/2018   TSH 2.50 03/17/2018   TSH 1.53 09/16/2017   TSH 1.87 03/17/2017   TSH 0.49 09/16/2016   TSH 0.28 (L) 07/30/2016    FREET4 0.93 03/17/2020   FREET4 0.93 03/18/2019   FREET4 0.74 10/22/2018   FREET4 0.93 09/16/2018   FREET4 0.84 05/19/2018   FREET4 0.72 03/17/2018   FREET4 0.87 09/16/2017   FREET4 0.83 03/17/2017   FREET4 0.78 09/16/2016   FREET4 0.68 07/30/2016  01/31/2016: TSH <0.01, free T4 2.09 (0.61-1.12), total T3 395 (71-180), TSI 310 (0-139%)   TSI antibodies were high in the past but normalized: Lab Results  Component Value Date   TSI 100 03/17/2018   TSI 190 (H) 03/17/2017    She was previously on biotin but she is now off.  Pt denies: - feeling nodules in neck - hoarseness - dysphagia - choking - SOB with lying down  No FH of thyroid disease or thyroid cancer. No h/o radiation tx to head or neck. No herbal supplements. No recent steroids use.   She had a TAH in 2012.  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  ROS:  + see HPI  I reviewed pt's medications, allergies, PMH, social hx, family hx, and changes were documented in the history of present illness. Otherwise, unchanged from my initial visit note.  Past Medical History:  Diagnosis Date   Anemia    Mild persistent asthma 01/24/2012   Post-nasal drip 06/11/2012   Past Surgical History:  Procedure Laterality Date   ABDOMINAL HYSTERECTOMY  04/15/2010   CESAREAN SECTION  2004, 2011   Social History   Social History   Marital status: Single    Spouse name:  N/A   Number of children: 2   Occupational History   Radiology tech extender, receptionist    Social History Main Topics   Smoking status: Never Smoker   Smokeless tobacco: Never Used   Alcohol use No   Drug use: No   Sexual activity: Yes    Birth control/ protection: Surgical - TAH   Current Outpatient Medications on File Prior to Visit  Medication Sig Dispense Refill   fluconazole (DIFLUCAN) 150 MG tablet Take 1 tablet by mouth  as needed as directed. 2 tablet 1   Multiple Vitamin (MULTIVITAMIN) tablet Take 1 tablet by mouth daily.     Omega-3  Fatty Acids (FISH OIL PO) Take by mouth.     valACYclovir (VALTREX) 1000 MG tablet 2 tablets     valACYclovir (VALTREX) 500 MG tablet TAKE 1 TABLET BY MOUTH 2 TIMES DAILY FOR 6 DOSES DURING OUTBREAK 30 tablet 3   No current facility-administered medications on file prior to visit.   Allergies  Allergen Reactions   Amoxicillin     Other reaction(s): sick on stomach, vomiting   Latex Itching    Itching and redness to affected site   Family History  Problem Relation Age of Onset   Breast cancer Maternal Grandmother        Pt unsure of age of onset   Asthma Neg Hx    Diabetes Neg Hx    Early death Neg Hx    Heart disease Neg Hx    Hyperlipidemia Neg Hx    Hypertension Neg Hx    Kidney disease Neg Hx    Stroke Neg Hx    She has diabetes, HTN, HL in aunts and uncles, heart problems in aunts and cancer in grandmother aunts and uncles  PE: BP 130/82 (BP Location: Right Arm, Patient Position: Sitting, Cuff Size: Normal)    Pulse 70    Ht 5\' 5"  (1.651 m)    Wt 263 lb 9.6 oz (119.6 kg)    LMP 08/14/2010    SpO2 97%    BMI 43.87 kg/m  Wt Readings from Last 3 Encounters:  06/12/21 263 lb 9.6 oz (119.6 kg)  03/17/20 265 lb 6.4 oz (120.4 kg)  03/18/19 258 lb (117 kg)   Constitutional: overweight, in NAD Eyes: PERRLA, EOMI, no exophthalmos ENT: moist mucous membranes, + symmetric, smooth, thyromegaly, no cervical lymphadenopathy Cardiovascular: RRR, No MRG Respiratory: CTA B Musculoskeletal: no deformities, strength intact in all 4 Skin: moist, warm, no rashes Neurological: no tremor with outstretched hands, DTR normal in all 4  ASSESSMENT: 1. Graves ds.  PLAN:  1. Patient with history of clinical thyrotoxicosis, with thyrotoxic symptoms: Weight loss of approximately 30 pounds initially, but no other thyrotoxic symptoms.  TSI antibodies were elevated, pointing towards a diagnosis of Graves' disease.  We started methimazole and were able to decrease the dose gradually until we finally  stopped in 09/2018.   -At last visit, all her TFTs were normal so we did not have to restart methimazole. -At today's visit, she denies thyrotoxic symptoms: No weight loss, palpitations, tremors, anxiety, heat intolerance, hyperdefecation.  At last visit, she had a 7 pound weight gain, which she attributed to working from home. -No signs of active Graves' ophthalmopathy: No double vision, blurry vision, eye pain, chemosis -At today's visit we will recheck her TFTs and see if we need to restart methimazole. -No beta-blocker required since she is not tremulous or tachycardic -I will see her back in a year but I  advised her to let me know if she develops thyrotoxic symptoms, in which case we need to check her labs sooner -We did discuss about possibly establishing care with an endocrinologist closer to home, but she would prefer to drive here and have several doctors appointments in the same day.  Component     Latest Ref Rng & Units 06/12/2021  Triiodothyronine,Free,Serum     2.3 - 4.2 pg/mL 4.0  T4,Free(Direct)     0.60 - 1.60 ng/dL 1.03  TSH     0.35 - 5.50 uIU/mL 1.31  Excellent TFTs.   Philemon Kingdom, MD PhD [facility] Endocrinology

## 2021-06-12 NOTE — Patient Instructions (Signed)
Please continue off methimazole.  Please stop at the lab.  Please come back for a follow-up appointment in 1 year. 

## 2022-06-10 ENCOUNTER — Ambulatory Visit: Payer: 59 | Admitting: Internal Medicine

## 2022-06-28 ENCOUNTER — Ambulatory Visit: Payer: 59 | Admitting: Internal Medicine

## 2024-03-01 ENCOUNTER — Other Ambulatory Visit: Payer: Self-pay | Admitting: Obstetrics and Gynecology

## 2024-03-01 DIAGNOSIS — K6289 Other specified diseases of anus and rectum: Secondary | ICD-10-CM

## 2024-04-23 ENCOUNTER — Other Ambulatory Visit (HOSPITAL_COMMUNITY): Payer: Self-pay

## 2024-04-23 MED ORDER — VALACYCLOVIR HCL 500 MG PO TABS
500.0000 mg | ORAL_TABLET | Freq: Two times a day (BID) | ORAL | 3 refills | Status: AC
  Filled 2024-04-23: qty 30, 15d supply, fill #0

## 2024-04-27 ENCOUNTER — Other Ambulatory Visit (HOSPITAL_COMMUNITY): Payer: Self-pay

## 2024-04-30 ENCOUNTER — Ambulatory Visit
Admission: RE | Admit: 2024-04-30 | Discharge: 2024-04-30 | Disposition: A | Discharge status: Home / Self Care | Source: Ambulatory Visit | Attending: Obstetrics and Gynecology | Admitting: Obstetrics and Gynecology

## 2024-04-30 DIAGNOSIS — K6289 Other specified diseases of anus and rectum: Secondary | ICD-10-CM

## 2024-04-30 MED ORDER — GADOPICLENOL 0.5 MMOL/ML IV SOLN
10.0000 mL | Freq: Once | INTRAVENOUS | Status: AC | PRN
  Administered 2024-04-30: 10 mL via INTRAVENOUS
